# Patient Record
Sex: Male | Born: 1935 | Race: White | Hispanic: No | Smoking: Former smoker
Health system: Southern US, Community
[De-identification: ages and names within clinical notes are randomized; demographics above are authoritative.]

## PROBLEM LIST (undated history)

## (undated) DIAGNOSIS — M1710 Unilateral primary osteoarthritis, unspecified knee: Secondary | ICD-10-CM

## (undated) DIAGNOSIS — L210 Seborrhea capitis: Secondary | ICD-10-CM

## (undated) DIAGNOSIS — G309 Alzheimer's disease, unspecified: Secondary | ICD-10-CM

## (undated) DIAGNOSIS — F32A Depression, unspecified: Secondary | ICD-10-CM

## (undated) DIAGNOSIS — N3090 Cystitis, unspecified without hematuria: Secondary | ICD-10-CM

## (undated) DIAGNOSIS — F329 Major depressive disorder, single episode, unspecified: Secondary | ICD-10-CM

## (undated) DIAGNOSIS — E559 Vitamin D deficiency, unspecified: Secondary | ICD-10-CM

## (undated) DIAGNOSIS — E46 Unspecified protein-calorie malnutrition: Secondary | ICD-10-CM

## (undated) DIAGNOSIS — B9689 Other specified bacterial agents as the cause of diseases classified elsewhere: Secondary | ICD-10-CM

## (undated) DIAGNOSIS — K5900 Constipation, unspecified: Secondary | ICD-10-CM

## (undated) DIAGNOSIS — I739 Peripheral vascular disease, unspecified: Secondary | ICD-10-CM

## (undated) DIAGNOSIS — B961 Klebsiella pneumoniae [K. pneumoniae] as the cause of diseases classified elsewhere: Secondary | ICD-10-CM

## (undated) DIAGNOSIS — I10 Essential (primary) hypertension: Secondary | ICD-10-CM

## (undated) DIAGNOSIS — IMO0002 Reserved for concepts with insufficient information to code with codable children: Secondary | ICD-10-CM

## (undated) DIAGNOSIS — F0280 Dementia in other diseases classified elsewhere without behavioral disturbance: Secondary | ICD-10-CM

## (undated) DIAGNOSIS — N189 Chronic kidney disease, unspecified: Secondary | ICD-10-CM

## (undated) DIAGNOSIS — D638 Anemia in other chronic diseases classified elsewhere: Secondary | ICD-10-CM

## (undated) HISTORY — DX: Cystitis, unspecified without hematuria: N30.90

## (undated) HISTORY — DX: Alzheimer's disease, unspecified: G30.9

## (undated) HISTORY — DX: Constipation, unspecified: K59.00

## (undated) HISTORY — DX: Dementia in other diseases classified elsewhere, unspecified severity, without behavioral disturbance, psychotic disturbance, mood disturbance, and anxiety: F02.80

## (undated) HISTORY — DX: Major depressive disorder, single episode, unspecified: F32.9

## (undated) HISTORY — DX: Depression, unspecified: F32.A

## (undated) HISTORY — DX: Other specified bacterial agents as the cause of diseases classified elsewhere: B96.89

## (undated) HISTORY — DX: Essential (primary) hypertension: I10

## (undated) HISTORY — DX: Anemia in other chronic diseases classified elsewhere: D63.8

## (undated) HISTORY — DX: Peripheral vascular disease, unspecified: I73.9

## (undated) HISTORY — DX: Vitamin D deficiency, unspecified: E55.9

## (undated) HISTORY — DX: Unspecified protein-calorie malnutrition: E46

## (undated) HISTORY — DX: Unilateral primary osteoarthritis, unspecified knee: M17.10

## (undated) HISTORY — DX: Chronic kidney disease, unspecified: N18.9

## (undated) HISTORY — DX: Seborrhea capitis: L21.0

## (undated) HISTORY — DX: Klebsiella pneumoniae (k. pneumoniae) as the cause of diseases classified elsewhere: B96.1

## (undated) HISTORY — DX: Reserved for concepts with insufficient information to code with codable children: IMO0002

---

## 2014-09-17 ENCOUNTER — Non-Acute Institutional Stay (SKILLED_NURSING_FACILITY): Payer: Medicare Other | Admitting: Internal Medicine

## 2014-09-17 DIAGNOSIS — Z993 Dependence on wheelchair: Secondary | ICD-10-CM | POA: Diagnosis not present

## 2014-09-17 DIAGNOSIS — IMO0002 Reserved for concepts with insufficient information to code with codable children: Secondary | ICD-10-CM

## 2014-09-17 DIAGNOSIS — K5900 Constipation, unspecified: Secondary | ICD-10-CM

## 2014-09-17 DIAGNOSIS — M179 Osteoarthritis of knee, unspecified: Secondary | ICD-10-CM | POA: Diagnosis not present

## 2014-09-17 DIAGNOSIS — F0280 Dementia in other diseases classified elsewhere without behavioral disturbance: Secondary | ICD-10-CM | POA: Insufficient documentation

## 2014-09-17 DIAGNOSIS — G309 Alzheimer's disease, unspecified: Secondary | ICD-10-CM

## 2014-09-17 DIAGNOSIS — F330 Major depressive disorder, recurrent, mild: Secondary | ICD-10-CM | POA: Diagnosis not present

## 2014-09-17 DIAGNOSIS — I1310 Hypertensive heart and chronic kidney disease without heart failure, with stage 1 through stage 4 chronic kidney disease, or unspecified chronic kidney disease: Secondary | ICD-10-CM | POA: Diagnosis not present

## 2014-09-17 DIAGNOSIS — G301 Alzheimer's disease with late onset: Secondary | ICD-10-CM

## 2014-09-17 DIAGNOSIS — E559 Vitamin D deficiency, unspecified: Secondary | ICD-10-CM

## 2014-09-17 DIAGNOSIS — M1710 Unilateral primary osteoarthritis, unspecified knee: Secondary | ICD-10-CM

## 2014-09-17 NOTE — Progress Notes (Signed)
Patient ID: Barry Castillo, male   DOB: 05/31/1917, 81 y.o.   MRN: [id number]     [facility]   PCP: No primary care provider on file.  Code Status: full code  No Known Allergies  Chief Complaint  Patient presents with  . New Admit To SNF     HPI:  81 year old patient is here for long term care. He has history of dementia, HTN, CKD, depression and is a high fall risk. Reviewed his FL2 form. Patient is seen in his room today. He is lying in bed, in no acute distress. He is intermittently confused, incontinent with bowel and bladder and needs assistance with bathing and dressing as per FL2. He is hard of hearing, can verbally communicate his needs and is on mechanical soft diet.  Review of Systems:  Constitutional: Negative for fever, chills, diaphoresis.  HENT: Negative for headache, congestion Respiratory: Negative for cough, shortness of breath and wheezing.   Cardiovascular: Negative for chest pain, palpitations, leg swelling.  Gastrointestinal: Negative for heartburn, nausea, vomiting, abdominal pain Genitourinary: Negative for dysuria Musculoskeletal: Negative for back pain, falls Skin: Negative for itching, rash.  Neurological: Negative for dizziness, tingling, focal weakness Psychiatric/Behavioral: positive for memory loss and depression.    Past Medical History  Diagnosis Date  . Chronic kidney disease   . Depression    No past surgical history on file. Social History:   reports that he does not drink alcohol or use illicit drugs. His tobacco history is not on file.  No family history on file.  Medications: Patient's Medications  New Prescriptions   No medications on file  Previous Medications   ACETAMINOPHEN (TYLENOL) 500 MG TABLET    Take 500 mg by mouth 2 (two) times daily.   CHOLECALCIFEROL (VITAMIN D) 1000 UNITS TABLET    Take 2,000 Units by mouth daily.   ESCITALOPRAM (LEXAPRO) 5 MG TABLET    Take 5 mg by mouth daily.   LISINOPRIL (PRINIVIL,ZESTRIL) 2.5 MG TABLET    Take 2.5 mg by mouth once.   SENNA (SENOKOT) 8.6 MG TABS TABLET    Take 1 tablet by mouth daily.  Modified Medications   No medications on file  Discontinued Medications   SELENIUM SULFIDE (SELSUN) 2.5 % SHAMPOO    Apply 1 application topically 2 (two) times a week.     Physical Exam Filed Vitals:   09/17/14 1453  BP: 156/88  Pulse: 74  Temp: 96.6 F (35.9 C)  Resp: 18  SpO2: 98%    General- elderly male, well built, in no acute distress Head- normocephalic, atraumatic Throat- moist mucus membrane Eyes- PERRLA, EOMI, no pallor, no icterus, no discharge, normal conjunctiva, normal sclera Neck- no cervical lymphadenopathy Cardiovascular- normal s1,s2, no murmurs, good radial pulses, no leg edema Respiratory- bilateral clear to auscultation, no wheeze, no rhonchi, no crackles, no use of accessory muscles Abdomen- bowel sounds present, soft, non tender Musculoskeletal- able to move all 4 extremities, on wheelchair Neurological- no focal deficit Skin- warm and dry Psychiatry- alert and oriented to person, normal mood and affect    Labs reviewed: None available for review  Assessment/Plan  Alzheimer's disease Stable, decline anticipated, continue assistance with ADLS, falls precuations, pressure ulcer prophylaxis, encourage out of bed. Monitor po intake and weight. On mechanical soft diet  Major depressive disorder Mood currently stable, continue lexapro 5 mg daily  Vitamin d def On vitamin d 2000 u daily, check vit d level  Hypertensive heart and  ckd bp slightly elevated, check bp once a day for next 1 week and if > 3 readings are > 140/90, adjust lisinopril. Check bmp, lipid panel  Constipation Continue senokot  OA Continue tylenol 500 mg bid and monitor  Wheelchair dependence Fall precautions  Goals of care: short term rehabilitation   Labs/tests ordered: cbc with diff, cmp, tsh, vitamin d , lipid  panel  Family/ staff Communication: reviewed care plan with patient and nursing supervisor    Oneal Grout, MD  [facility] [phone] (Monday-Friday 8 am - 5 pm) [phone] (afterhours)

## 2014-09-22 LAB — LIPID PANEL
Cholesterol: 146 mg/dL (ref 0–200)
HDL: 38 mg/dL (ref 35–70)
LDL Cholesterol: 94 mg/dL
TRIGLYCERIDES: 71 mg/dL (ref 40–160)

## 2014-09-22 LAB — TSH: TSH: 3.48 u[IU]/mL (ref 0.41–5.90)

## 2014-10-14 ENCOUNTER — Encounter: Payer: Self-pay | Admitting: Adult Health

## 2014-10-14 ENCOUNTER — Non-Acute Institutional Stay (SKILLED_NURSING_FACILITY): Payer: Medicare Other | Admitting: Adult Health

## 2014-10-14 DIAGNOSIS — K5900 Constipation, unspecified: Secondary | ICD-10-CM

## 2014-10-14 DIAGNOSIS — F330 Major depressive disorder, recurrent, mild: Secondary | ICD-10-CM

## 2014-10-14 DIAGNOSIS — Z993 Dependence on wheelchair: Secondary | ICD-10-CM

## 2014-10-14 DIAGNOSIS — E559 Vitamin D deficiency, unspecified: Secondary | ICD-10-CM

## 2014-10-14 DIAGNOSIS — I10 Essential (primary) hypertension: Secondary | ICD-10-CM | POA: Diagnosis not present

## 2014-10-14 DIAGNOSIS — M179 Osteoarthritis of knee, unspecified: Secondary | ICD-10-CM | POA: Diagnosis not present

## 2014-10-14 DIAGNOSIS — F0280 Dementia in other diseases classified elsewhere without behavioral disturbance: Secondary | ICD-10-CM

## 2014-10-14 DIAGNOSIS — IMO0002 Reserved for concepts with insufficient information to code with codable children: Secondary | ICD-10-CM

## 2014-10-14 DIAGNOSIS — G309 Alzheimer's disease, unspecified: Secondary | ICD-10-CM | POA: Diagnosis not present

## 2014-10-14 DIAGNOSIS — E46 Unspecified protein-calorie malnutrition: Secondary | ICD-10-CM | POA: Diagnosis not present

## 2014-10-14 DIAGNOSIS — M1710 Unilateral primary osteoarthritis, unspecified knee: Secondary | ICD-10-CM

## 2014-10-14 NOTE — Progress Notes (Signed)
Patient ID: Barry Castillo, male   DOB: 02/16/1917, 81 y.o.   MRN: [id number]   10/14/2014  Facility:  Nursing Home Location:  [facility] Room Number: 1003-1 LEVEL OF CARE:  SNF (31)    Chief Complaint  Patient presents with  . Medical Management of Chronic Issues    Depression, hypertension, constipation, vitamin D deficiency, osteoarthrosis, protein calorie malnutrition, and wheelchair dependence    HISTORY OF PRESENT ILLNESS:   This is a 81 year old male who [facility]. He has been admitted for long-term care. He has history of dementia, hypertension, depression, cataract and falls.  Lab review shows albumin of 3.3 - low. He eats 50-100% of his meals. Referral to RD will be done.  PAST MEDICAL HISTORY:  Past Medical History  Diagnosis Date  . Chronic kidney disease   . Depression     CURRENT MEDICATIONS: Reviewed per MAR/see medication list  No Known Allergies   REVIEW OF SYSTEMS: unable to obtain due to advance dementia   PHYSICAL EXAMINATION  GENERAL: no acute distress, normal body habitus EYES: conjunctivae normal, sclerae normal, normal eye lids NECK: supple, trachea midline, no neck masses, no thyroid tenderness, no thyromegaly LYMPHATICS: no LAN in the neck, no supraclavicular LAN RESPIRATORY: breathing is even & unlabored, BS CTAB CARDIAC: RRR, no murmur,no extra heart sounds, no edema GI: abdomen soft, normal BS, no masses, no tenderness, no hepatomegaly, no splenomegaly EXTREMITIES: able to move all 4 extremities PSYCHIATRIC: the patient is alert & oriented to person, affect & behavior appropriate  LABS/RADIOLOGY: 09/18/14  vitamin D total 38 vitamin D 3  38 vitamin D 2 <8 vitamin D 33 WBC 6.0 hemoglobin 12.5 hematocrit 37.0 MCV 93.0 sodium 137 potassium 4.3 glucose 83 BUN 17 creatinine 0.98 total bilirubin 1.1 alkaline phosphatase 50 SGOT 21 SGPT 19 total protein 6.1 albumin 3.3  calcium 8.4 cholesterol 146 triglyceride 71 HDL 38 LDL 94 TSH 3.481  ASSESSMENT/PLAN:  Hypertension - well controlled; continue lisinopril 2.5 mg by mouth daily Depression - mood is stable; continue Lexapro 5 mg by mouth daily Constipation - continue senna 1 tab by mouth daily Vitamin D deficiency - continue supplementation Osteoarthrosis - continue Tylenol 500 mg by mouth twice a day PRN Protein calorie malnutrition - albumin 3.3; RD consultation; continue supplementation Dependence and wheelchair - fall precautions; continue physical support Alzheimer's dementia - stable   Goals of care:   long-term care   Labs/test ordered: none     [facility]INA, NP [facility] [phone]

## 2014-12-10 ENCOUNTER — Non-Acute Institutional Stay (SKILLED_NURSING_FACILITY): Payer: Medicare Other | Admitting: Adult Health

## 2014-12-10 ENCOUNTER — Encounter: Payer: Self-pay | Admitting: Adult Health

## 2014-12-10 DIAGNOSIS — M1710 Unilateral primary osteoarthritis, unspecified knee: Secondary | ICD-10-CM

## 2014-12-10 DIAGNOSIS — F330 Major depressive disorder, recurrent, mild: Secondary | ICD-10-CM | POA: Diagnosis not present

## 2014-12-10 DIAGNOSIS — K5900 Constipation, unspecified: Secondary | ICD-10-CM

## 2014-12-10 DIAGNOSIS — E46 Unspecified protein-calorie malnutrition: Secondary | ICD-10-CM

## 2014-12-10 DIAGNOSIS — E559 Vitamin D deficiency, unspecified: Secondary | ICD-10-CM | POA: Diagnosis not present

## 2014-12-10 DIAGNOSIS — G309 Alzheimer's disease, unspecified: Secondary | ICD-10-CM | POA: Diagnosis not present

## 2014-12-10 DIAGNOSIS — IMO0002 Reserved for concepts with insufficient information to code with codable children: Secondary | ICD-10-CM

## 2014-12-10 DIAGNOSIS — I10 Essential (primary) hypertension: Secondary | ICD-10-CM

## 2014-12-10 DIAGNOSIS — Z993 Dependence on wheelchair: Secondary | ICD-10-CM | POA: Diagnosis not present

## 2014-12-10 DIAGNOSIS — M179 Osteoarthritis of knee, unspecified: Secondary | ICD-10-CM

## 2014-12-10 DIAGNOSIS — F0280 Dementia in other diseases classified elsewhere without behavioral disturbance: Secondary | ICD-10-CM

## 2014-12-10 NOTE — Progress Notes (Signed)
Patient ID: Barry Castillo, male   DOB: 10-Dec-1916, 81 y.o.   MRN: [id number]   12/10/2014  Facility:  Nursing Home Location:  [facility] Room Number: 1003-1 LEVEL OF CARE:  SNF (31)    Chief Complaint  Patient presents with  . Medical Management of Chronic Issues    Hypertension, depression, constipation, Alzheimer's dementia, wheelchair dependence, protein calorie malnutrition and vitamin D deficiency    HISTORY OF PRESENT ILLNESS:   This is a 81 year old male who is being seen for a routine visit. He is a long term resident @ [address]. Latest blood pressure is 134/64, well controlled hypertension. He takes lisinopril 2.5 mg by mouth daily. His mood this is stable and currently takes Lexapro 5 mg daily for depression. His dementia is stable. He uses wheelchair to move around the facility.   PAST MEDICAL HISTORY:  Past Medical History  Diagnosis Date  . Chronic kidney disease   . Depression     CURRENT MEDICATIONS: Reviewed per MAR/see medication list  No Known Allergies   REVIEW OF SYSTEMS: unable to obtain due to advance dementia   PHYSICAL EXAMINATION  GENERAL: no acute distress, normal body habitus NECK: supple, trachea midline, no neck masses, no thyroid tenderness, no thyromegaly LYMPHATICS: no LAN in the neck, no supraclavicular LAN RESPIRATORY: breathing is even & unlabored, BS CTAB CARDIAC: RRR, no murmur,no extra heart sounds, no edema GI: abdomen soft, normal BS, no masses, no tenderness, no hepatomegaly, no splenomegaly EXTREMITIES: able to move all 4 extremities; uses wheelchair PSYCHIATRIC: the patient is alert & oriented to person, affect & behavior appropriate  LABS/RADIOLOGY: 09/22/14  WBC 6.0 hemoglobin 12.5 hematocrit 37.0 MCV 93.0 platelet 208 sodium 137 potassium 4.3 glucose 83 BUN 17 creatinine 0.98 total bilirubin 1.1 alkaline phosphatase 50 SGOT 21 SGPT 19 total protein 6.1 albumin 3.3 calcium 8.4 cholesterol 146  triglycerides 71 HDL 38 LDL 94 TSH 3.481 09/18/14  vitamin D total 38 vitamin D 3  38 vitamin D 2 <8 vitamin D 33 WBC 6.0 hemoglobin 12.5 hematocrit 37.0 MCV 93.0 sodium 137 potassium 4.3 glucose 83 BUN 17 creatinine 0.98 total bilirubin 1.1 alkaline phosphatase 50 SGOT 21 SGPT 19 total protein 6.1 albumin 3.3 calcium 8.4 cholesterol 146 triglyceride 71 HDL 38 LDL 94 TSH 3.481  ASSESSMENT/PLAN:  Hypertension - well controlled; continue lisinopril 2.5 mg by mouth daily Depression - mood is stable; continue Lexapro 5 mg by mouth daily Constipation - continue senna 1 tab by mouth daily Vitamin D deficiency - continue vitamin D 3 2000 units 1 capsule by mouth daily at bedtime Osteoarthrosis - continue Tylenol 500 mg by mouth twice a day PRN Protein calorie malnutrition - albumin 3.3;  continue supplementation Dependence and wheelchair - fall precautions; continue physical support Alzheimer's dementia - stable   Goals of care:   long-term care   Labs/test ordered: none     [facility], NP [facility] [phone]

## 2015-01-14 ENCOUNTER — Encounter: Payer: Self-pay | Admitting: Adult Health

## 2015-01-14 ENCOUNTER — Non-Acute Institutional Stay (SKILLED_NURSING_FACILITY): Payer: Medicare Other | Admitting: Adult Health

## 2015-01-14 DIAGNOSIS — I10 Essential (primary) hypertension: Secondary | ICD-10-CM

## 2015-01-14 DIAGNOSIS — F330 Major depressive disorder, recurrent, mild: Secondary | ICD-10-CM

## 2015-01-14 DIAGNOSIS — K5900 Constipation, unspecified: Secondary | ICD-10-CM | POA: Diagnosis not present

## 2015-01-14 DIAGNOSIS — IMO0002 Reserved for concepts with insufficient information to code with codable children: Secondary | ICD-10-CM

## 2015-01-14 DIAGNOSIS — E559 Vitamin D deficiency, unspecified: Secondary | ICD-10-CM

## 2015-01-14 DIAGNOSIS — F0280 Dementia in other diseases classified elsewhere without behavioral disturbance: Secondary | ICD-10-CM

## 2015-01-14 DIAGNOSIS — M1710 Unilateral primary osteoarthritis, unspecified knee: Secondary | ICD-10-CM

## 2015-01-14 DIAGNOSIS — M179 Osteoarthritis of knee, unspecified: Secondary | ICD-10-CM | POA: Diagnosis not present

## 2015-01-14 DIAGNOSIS — G309 Alzheimer's disease, unspecified: Secondary | ICD-10-CM

## 2015-01-23 NOTE — Progress Notes (Signed)
Patient ID: Barry Castillo, male   DOB: Feb 10, 1917, 81 y.o.   MRN: [id number]   01/14/15  Facility:  Nursing Home Location:  [facility] Room Number: 1003-1 LEVEL OF CARE:  SNF (31)   Chief Complaint  Patient presents with  . Medical Management of Chronic Issues    Hypertension, depression, constipation, Alzheimer's dementia,  protein calorie malnutrition and vitamin D deficiency    HISTORY OF PRESENT ILLNESS:   This is a 81 year old male who is being seen for a routine visit. He is a long term resident @ [address]. Latest blood pressure is 129/58, well controlled hypertension. He takes lisinopril 2.5 mg by mouth daily. His mood is stable and currently takes Lexapro 5 mg daily for depression. His dementia is stable. He uses wheelchair to move around the facility.  PAST MEDICAL HISTORY:  Past Medical History  Diagnosis Date  . Chronic kidney disease   . Depression     CURRENT MEDICATIONS: Reviewed per MAR/see medication list    Medication List       This list is accurate as of: 01/14/15 11:59 PM.  Always use your most recent med list.               acetaminophen 500 MG tablet  Commonly known as:  TYLENOL  Take 500 mg by mouth 2 (two) times daily.     cholecalciferol 1000 UNITS tablet  Commonly known as:  VITAMIN D  Take 2,000 Units by mouth daily.     escitalopram 5 MG tablet  Commonly known as:  LEXAPRO  Take 5 mg by mouth daily. For depression     lisinopril 2.5 MG tablet  Commonly known as:  PRINIVIL,ZESTRIL  Take 2.5 mg by mouth daily. For HTN     senna 8.6 MG Tabs tablet  Commonly known as:  SENOKOT  Take 1 tablet by mouth daily. For constipation     SYSTANE 0.4-0.3 % Gel  Generic drug:  Polyethyl Glycol-Propyl Glycol  Apply 1 drop to eye 3 (three) times daily.         No Known Allergies   REVIEW OF SYSTEMS: unable to obtain due to advance dementia   PHYSICAL EXAMINATION  GENERAL: no acute distress, normal body  habitus NECK: supple, trachea midline, no neck masses, no thyroid tenderness, no thyromegaly LYMPHATICS: no LAN in the neck, no supraclavicular LAN RESPIRATORY: breathing is even & unlabored, BS CTAB CARDIAC: RRR, no murmur,no extra heart sounds, no edema GI: abdomen soft, normal BS, no masses, no tenderness, no hepatomegaly, no splenomegaly EXTREMITIES: able to move all 4 extremities; uses wheelchair PSYCHIATRIC: the patient is alert & oriented to person, affect & behavior appropriate  LABS/RADIOLOGY: Labs reviewed: 09/22/14  WBC 6.0 hemoglobin 12.5 hematocrit 37.0 MCV 93.0 platelet 208 sodium 137 potassium 4.3 glucose 83 BUN 17 creatinine 0.98 total bilirubin 1.1 alkaline phosphatase 50 SGOT 21 SGPT 19 total protein 6.1 albumin 3.3 calcium 8.4 cholesterol 146 triglycerides 71 HDL 38 LDL 94 TSH 3.481 09/18/14  vitamin D total 38 vitamin D 3  38 vitamin D 2 <8 vitamin D 33 WBC 6.0 hemoglobin 12.5 hematocrit 37.0 MCV 93.0 sodium 137 potassium 4.3 glucose 83 BUN 17 creatinine 0.98 total bilirubin 1.1 alkaline phosphatase 50 SGOT 21 SGPT 19 total protein 6.1 albumin 3.3 calcium 8.4 cholesterol 146 triglyceride 71 HDL 38 LDL 94 TSH 3.481  ASSESSMENT/PLAN:  Hypertension - well controlled; continue lisinopril 2.5 mg by mouth daily Depression - mood is stable; continue Lexapro  5 mg by mouth daily Constipation - continue senna 1 tab by mouth daily Vitamin D deficiency - continue vitamin D 3 2000 units 1 capsule by mouth daily at bedtime Osteoarthrosis - continue Tylenol 500 mg by mouth twice a day PRN Protein calorie malnutrition - albumin 3.3;  continue supplementation Alzheimer's dementia - stable   Goals of care:   long-term care   Labs/test ordered: none     [facility], NP [facility] [phone]

## 2015-02-05 ENCOUNTER — Non-Acute Institutional Stay (SKILLED_NURSING_FACILITY): Payer: Medicare Other | Admitting: Adult Health

## 2015-02-05 ENCOUNTER — Encounter: Payer: Self-pay | Admitting: Adult Health

## 2015-02-05 DIAGNOSIS — F330 Major depressive disorder, recurrent, mild: Secondary | ICD-10-CM

## 2015-02-05 DIAGNOSIS — M1710 Unilateral primary osteoarthritis, unspecified knee: Secondary | ICD-10-CM

## 2015-02-05 DIAGNOSIS — M179 Osteoarthritis of knee, unspecified: Secondary | ICD-10-CM | POA: Diagnosis not present

## 2015-02-05 DIAGNOSIS — IMO0002 Reserved for concepts with insufficient information to code with codable children: Secondary | ICD-10-CM

## 2015-02-05 DIAGNOSIS — K5900 Constipation, unspecified: Secondary | ICD-10-CM

## 2015-02-05 DIAGNOSIS — G309 Alzheimer's disease, unspecified: Secondary | ICD-10-CM | POA: Diagnosis not present

## 2015-02-05 DIAGNOSIS — I10 Essential (primary) hypertension: Secondary | ICD-10-CM

## 2015-02-05 DIAGNOSIS — F0280 Dementia in other diseases classified elsewhere without behavioral disturbance: Secondary | ICD-10-CM

## 2015-02-05 DIAGNOSIS — E559 Vitamin D deficiency, unspecified: Secondary | ICD-10-CM | POA: Diagnosis not present

## 2015-02-05 DIAGNOSIS — E46 Unspecified protein-calorie malnutrition: Secondary | ICD-10-CM | POA: Diagnosis not present

## 2015-04-13 LAB — BASIC METABOLIC PANEL
BUN: 12 mg/dL (ref 4–21)
CREATININE: 0.9 mg/dL (ref 0.6–1.3)
Glucose: 78 mg/dL
Potassium: 3.7 mmol/L (ref 3.4–5.3)
SODIUM: 139 mmol/L (ref 137–147)

## 2015-04-13 LAB — HEPATIC FUNCTION PANEL
ALT: 8 U/L — AB (ref 10–40)
AST: 15 U/L (ref 14–40)
Alkaline Phosphatase: 70 U/L (ref 25–125)
BILIRUBIN, TOTAL: 0.7 mg/dL

## 2015-04-13 LAB — CBC AND DIFFERENTIAL
HCT: 37 % — AB (ref 41–53)
Hemoglobin: 12 g/dL — AB (ref 13.5–17.5)
Platelets: 210 10*3/uL (ref 150–399)
WBC: 6.5 10^3/mL

## 2015-04-18 NOTE — Progress Notes (Signed)
Patient ID: Barry Castillo, male   DOB: 11/29/1916, 81 y.o.   MRN: [id number]   02/05/15  Facility:  Nursing Home Location:  [facility] Room Number: 1003-1 LEVEL OF CARE:  SNF (31)   Chief Complaint  Patient presents with  . Medical Management of Chronic Issues    Hypertension, depression, constipation, osteoarthrosis, Alzheimer's dementia, protein calorie malnutrition  and vitamin D deficiency    HISTORY OF PRESENT ILLNESS:   This is a 81 year old male who is being seen for a routine visit. He is a long term resident [facility] [address] [facility]. He takes lisinopril 2.5 mg by mouth daily. His mood is stable and currently takes Lexapro 5 mg daily for depression. His dementia is stable. He uses wheelchair to move around the facility.  PAST MEDICAL HISTORY:  Past Medical History  Diagnosis Date  . Chronic kidney disease   . Depression     CURRENT MEDICATIONS: Reviewed per MAR/see medication list    Medication List       This list is accurate as of: 02/05/15 11:59 PM.  Always use your most recent med list.               acetaminophen 500 MG tablet  Commonly known as:  TYLENOL  Take 500 mg by mouth 2 (two) times daily.     cholecalciferol 1000 UNITS tablet  Commonly known as:  VITAMIN D  Take 2,000 Units by mouth daily.     escitalopram 5 MG tablet  Commonly known as:  LEXAPRO  Take 5 mg by mouth daily. For depression     lisinopril 2.5 MG tablet  Commonly known as:  PRINIVIL,ZESTRIL  Take 2.5 mg by mouth daily. For HTN     senna 8.6 MG Tabs tablet  Commonly known as:  SENOKOT  Take 1 tablet by mouth daily. For constipation     SYSTANE 0.4-0.3 % Gel ophthalmic gel  Generic drug:  Polyethyl Glycol-Propyl Glycol  Apply 1 drop to eye 3 (three) times daily.        No Known Allergies   REVIEW OF SYSTEMS: unable to obtain due to advance dementia   PHYSICAL EXAMINATION  GENERAL: no acute distress, normal body habitus NECK: supple, trachea  midline, no neck masses, no thyroid tenderness, no thyromegaly LYMPHATICS: no LAN in the neck, no supraclavicular LAN RESPIRATORY: breathing is even & unlabored, BS CTAB CARDIAC: RRR, no murmur,no extra heart sounds, no edema GI: abdomen soft, normal BS, no masses, no tenderness, no hepatomegaly, no splenomegaly EXTREMITIES: able to move all 4 extremities; uses wheelchair PSYCHIATRIC: the patient is alert & oriented to person only, affect & behavior appropriate  LABS/RADIOLOGY: Labs reviewed: 09/22/14  WBC 6.0 hemoglobin 12.5 hematocrit 37.0 MCV 93.0 platelet 208 sodium 137 potassium 4.3 glucose 83 BUN 17 creatinine 0.98 total bilirubin 1.1 alkaline phosphatase 50 SGOT 21 SGPT 19 total protein 6.1 albumin 3.3 calcium 8.4 cholesterol 146 triglycerides 71 HDL 38 LDL 94 TSH 3.481 09/18/14  vitamin D total 38 vitamin D 3  38 vitamin D 2 <8 vitamin D 33 WBC 6.0 hemoglobin 12.5 hematocrit 37.0 MCV 93.0 sodium 137 potassium 4.3 glucose 83 BUN 17 creatinine 0.98 total bilirubin 1.1 alkaline phosphatase 50 SGOT 21 SGPT 19 total protein 6.1 albumin 3.3 calcium 8.4 cholesterol 146 triglyceride 71 HDL 38 LDL 94 TSH 3.481  ASSESSMENT/PLAN:  Hypertension - well controlled; continue lisinopril 2.5 mg by mouth daily  Depression - mood is stable; continue Lexapro 5 mg by mouth  daily  Constipation - continue senna 1 tab by mouth daily  Vitamin D deficiency - continue vitamin D 3 2000 units 1 capsule by mouth daily at bedtime  Osteoarthrosis - continue Tylenol 500 mg by mouth twice a day PRN  Protein calorie malnutrition - albumin 3.3;  continue supplementation  Alzheimer's dementia - stable     Goals of care:   long-term care    [facility], NP [facility] [phone]

## 2015-04-26 ENCOUNTER — Non-Acute Institutional Stay (SKILLED_NURSING_FACILITY): Payer: Medicare Other | Admitting: Internal Medicine

## 2015-04-26 ENCOUNTER — Encounter: Payer: Self-pay | Admitting: Internal Medicine

## 2015-04-26 DIAGNOSIS — K5900 Constipation, unspecified: Secondary | ICD-10-CM

## 2015-04-26 DIAGNOSIS — M179 Osteoarthritis of knee, unspecified: Secondary | ICD-10-CM

## 2015-04-26 DIAGNOSIS — B9689 Other specified bacterial agents as the cause of diseases classified elsewhere: Secondary | ICD-10-CM

## 2015-04-26 DIAGNOSIS — D638 Anemia in other chronic diseases classified elsewhere: Secondary | ICD-10-CM | POA: Diagnosis not present

## 2015-04-26 DIAGNOSIS — N3090 Cystitis, unspecified without hematuria: Secondary | ICD-10-CM

## 2015-04-26 DIAGNOSIS — G309 Alzheimer's disease, unspecified: Secondary | ICD-10-CM

## 2015-04-26 DIAGNOSIS — I10 Essential (primary) hypertension: Secondary | ICD-10-CM

## 2015-04-26 DIAGNOSIS — M1710 Unilateral primary osteoarthritis, unspecified knee: Secondary | ICD-10-CM

## 2015-04-26 DIAGNOSIS — E46 Unspecified protein-calorie malnutrition: Secondary | ICD-10-CM | POA: Diagnosis not present

## 2015-04-26 DIAGNOSIS — F330 Major depressive disorder, recurrent, mild: Secondary | ICD-10-CM

## 2015-04-26 DIAGNOSIS — F0280 Dementia in other diseases classified elsewhere without behavioral disturbance: Secondary | ICD-10-CM

## 2015-04-26 DIAGNOSIS — B961 Klebsiella pneumoniae [K. pneumoniae] as the cause of diseases classified elsewhere: Secondary | ICD-10-CM

## 2015-04-26 DIAGNOSIS — IMO0002 Reserved for concepts with insufficient information to code with codable children: Secondary | ICD-10-CM

## 2015-04-26 NOTE — Progress Notes (Signed)
Patient ID: Barry Castillo, male   DOB: 03-05-1917, 81 y.o.   MRN: [id number]      [facility]   PCP: No primary care provider on file.  Code Status: full code  No Known Allergies  Chief Complaint  Patient presents with  . Medical Management of Chronic Issues    Routine Visit      HPI:  81 year old patient is seen for routine visit and is here for long term care. He has history of dementia, HTN, CKD, depression and is a high fall risk. He is intermittently confused, incontinent with bowel and bladder and needs assistance with bathing and dressing. He can feed himself. No recent falls reported. He is hard of hearing, can verbally communicate his needs. He is currently being treated for klebsiella UTI  Review of Systems:  Constitutional: Negative for fever, chills, diaphoresis.  HENT: Negative for headache, congestion Respiratory: Negative for cough, shortness of breath and wheezing.   Cardiovascular: Negative for chest pain, palpitations, leg swelling.  Gastrointestinal: Negative for heartburn, nausea, vomiting, abdominal pain Genitourinary: Negative for dysuria Musculoskeletal: Negative for back pain, falls Skin: Negative for itching, rash.  Neurological: Negative for dizziness, tingling, focal weakness Psychiatric/Behavioral: positive for memory loss and depression.    Past Medical History  Diagnosis Date  . Chronic kidney disease   . Depression    Medications: Patient's Medications  New Prescriptions   No medications on file  Previous Medications   ACETAMINOPHEN (TYLENOL) 500 MG TABLET    Take 500 mg by mouth 2 (two) times daily.   CHOLECALCIFEROL (VITAMIN D3) 2000 UNITS TABS    Take by mouth daily.   CIPROFLOXACIN (CIPRO) 250 MG TABLET    Take 250 mg by mouth 2 (two) times daily. End 04/28/15   ESCITALOPRAM (LEXAPRO) 5 MG TABLET    Take 5 mg by mouth daily. For depression   LISINOPRIL (PRINIVIL,ZESTRIL) 2.5 MG TABLET    Take 2.5 mg  by mouth daily. For HTN   LOPERAMIDE (IMODIUM) 2 MG CAPSULE    Take 4 mg by mouth once. With initial episode of diarrhea   NUTRITIONAL SUPPLEMENTS (BALANCED NUTRITIONAL SHAKE PLS PO)    Take by mouth. Mighty shake twice daily with med pass 2 pm, 8 pm   NYSTATIN CREAM (MYCOSTATIN)    Apply 1 application topically 2 (two) times daily as needed for dry skin.   POLYETHYL GLYCOL-PROPYL GLYCOL (SYSTANE) 0.4-0.3 % GEL    Apply 1 drop to eye 3 (three) times daily.   PROTEIN (PROCEL) POWD    Take 1 scoop by mouth 2 (two) times daily.   SACCHAROMYCES BOULARDII (FLORASTOR) 250 MG CAPSULE    Take 250 mg by mouth 2 (two) times daily. X 2 weeks, end 05/02/15   SENNA (SENOKOT) 8.6 MG TABS TABLET    Take 1 tablet by mouth daily. For constipation   ZINC OXIDE (BALMEX) 11.3 % CREA CREAM    Apply 1 application topically. After each incontinent episode each shift  Modified Medications   No medications on file  Discontinued Medications   CHOLECALCIFEROL (VITAMIN D) 1000 UNITS TABLET    Take 2,000 Units by mouth daily.     Physical Exam Filed Vitals:   04/26/15 1516  BP: 136/69  Pulse: 56  Temp: 98.1 F (36.7 C)  TempSrc: Oral  Resp: 18  SpO2: 96%   Wt Readings from Last 3 Encounters:  04/26/15 191 lb 12.8 oz (87 kg)  02/05/15 193 lb 6.4  oz (87.726 kg)  01/14/15 197 lb 3.2 oz (89.449 kg)    General- elderly male, well built, in no acute distress Head- normocephalic, atraumatic Throat- moist mucus membrane Eyes- PERRLA, EOMI, no pallor, no icterus, no discharge, normal conjunctiva, normal sclera Neck- no cervical lymphadenopathy Cardiovascular- normal s1,s2, no murmurs, good radial pulses, has ankle edema Respiratory- bilateral clear to auscultation, no wheeze, no rhonchi, no crackles, no use of accessory muscles Abdomen- bowel sounds present, soft, non tender Musculoskeletal- able to move all 4 extremities, on wheelchair Neurological- no focal deficit Skin- warm and dry, has  seborrhea Psychiatry- alert and oriented to person, normal mood and affect    Labs reviewed: CBC Latest Ref Rng 04/13/2015  WBC - 6.5  Hemoglobin 13.5 - 17.5 g/dL 12.0(A)  Hematocrit 41 - 53 % 37(A)  Platelets 150 - 399 K/L 210   CMP Latest Ref Rng 04/13/2015  BUN 4 - 21 mg/dL 12  Creatinine 0.6 - 1.3 mg/dL 0.9  Sodium 409137 - 811147 mmol/L 139  Potassium 3.4 - 5.3 mmol/L 3.7  Alkaline Phos 25 - 125 U/L 70  AST 14 - 40 U/L 15  ALT 10 - 40 U/L 8(A)     Assessment/Plan  Klebsiella uti Continue ciprofloxacin 250 mg bid until 04/28/15, currently asymptomatic. Monitor and encourage hydration  Alzheimer's disease Stable, decline anticipated, continue assistance with ADLS, falls precuations, pressure ulcer prophylaxis, encourage out of bed. Monitor po intake and weight. On mechanical soft diet  Protein calorie malnutrition Losing weight. Decline anticipated with his dementia. Monitor weight and po intake. Continue procel and mighty shake.   HTN Stable bp, continue lisinopril 2.5 mg daily and monitor bp  Major depressive disorder Mood currently stable, has been on lexapro atleast since before march on admission. D/c lexapro in attempt of GDR and monitor  OA Continue tylenol 500 mg bid and vitamin d supplement  Constipation Continue senokot  Anemia of chronic disease Mild, monitor clinically for now   [facility] Vernisha Bacote, MD  [facility] [phone] (Monday-Friday 8 am - 5 pm) [phone] (afterhours)

## 2015-06-02 ENCOUNTER — Non-Acute Institutional Stay (SKILLED_NURSING_FACILITY): Payer: Medicare Other | Admitting: Adult Health

## 2015-06-02 DIAGNOSIS — E559 Vitamin D deficiency, unspecified: Secondary | ICD-10-CM

## 2015-06-02 DIAGNOSIS — M179 Osteoarthritis of knee, unspecified: Secondary | ICD-10-CM

## 2015-06-02 DIAGNOSIS — E46 Unspecified protein-calorie malnutrition: Secondary | ICD-10-CM | POA: Diagnosis not present

## 2015-06-02 DIAGNOSIS — F330 Major depressive disorder, recurrent, mild: Secondary | ICD-10-CM | POA: Diagnosis not present

## 2015-06-02 DIAGNOSIS — IMO0002 Reserved for concepts with insufficient information to code with codable children: Secondary | ICD-10-CM

## 2015-06-02 DIAGNOSIS — K5900 Constipation, unspecified: Secondary | ICD-10-CM

## 2015-06-02 DIAGNOSIS — I10 Essential (primary) hypertension: Secondary | ICD-10-CM

## 2015-06-02 DIAGNOSIS — M1710 Unilateral primary osteoarthritis, unspecified knee: Secondary | ICD-10-CM

## 2015-06-14 ENCOUNTER — Encounter: Payer: Self-pay | Admitting: Adult Health

## 2015-06-14 NOTE — Progress Notes (Signed)
Patient ID: Barry Castillo, male   DOB: Feb 04, 1917, 81 y.o.   MRN: [id number]   DATE:  06/02/15  Facility:  Nursing Home Location:  [facility] Room Number: 1003-1 LEVEL OF CARE:  SNF (31)   Chief Complaint  Patient presents with  . Medical Management of Chronic Issues    Hypertension, depression, constipation, osteoarthrosis, Alzheimer's dementia, protein calorie malnutrition  and vitamin D deficiency    HISTORY OF PRESENT ILLNESS:   This is a 81 year old male who is being seen for a routine visit. He is a long term resident @ [address]. He takes lisinopril 2.5 mg by mouth daily. Lexapro was recently discontinued per pharmacy recommendation - GDR attempt. His mood is stable. His dementia is stable. He uses wheelchair to move around the facility.  PAST MEDICAL HISTORY:  Past Medical History  Diagnosis Date  . Chronic kidney disease   . Depression     CURRENT MEDICATIONS: Reviewed per MAR/see medication list    Medication List       This list is accurate as of: 06/02/15 11:59 PM.  Always use your most recent med list.               acetaminophen 500 MG tablet  Commonly known as:  TYLENOL  Take 500 mg by mouth 2 (two) times daily.     BALANCED NUTRITIONAL SHAKE PLS PO  Take by mouth. Mighty shake twice daily with med pass 2 pm, 8 pm     hydrocerin Crea  Apply 1 application topically 2 (two) times daily. To both legs     lisinopril 2.5 MG tablet  Commonly known as:  PRINIVIL,ZESTRIL  Take 2.5 mg by mouth daily. For HTN     loperamide 2 MG capsule  Commonly known as:  IMODIUM  Take 4 mg by mouth once. With initial episode of diarrhea     nystatin cream  Commonly known as:  MYCOSTATIN  Apply 1 application topically 2 (two) times daily as needed for dry skin. Apply to abdominal folds     PROCEL Powd  Take 1 scoop by mouth 2 (two) times daily.     selenium sulfide 2.5 % shampoo  Commonly known as:  SELSUN  Apply 1 application  topically daily as needed for irritation. Tuesdays and Fridays     senna 8.6 MG Tabs tablet  Commonly known as:  SENOKOT  Take 1 tablet by mouth daily as needed. For constipation     SYSTANE 0.4-0.3 % Gel ophthalmic gel  Generic drug:  Polyethyl Glycol-Propyl Glycol  Apply 1 drop to eye 3 (three) times daily.     Vitamin D3 2000 UNITS Tabs  Take by mouth daily.     zinc oxide 11.3 % Crea cream  Commonly known as:  BALMEX  Apply 1 application topically. After each incontinent episode each shift        No Known Allergies   REVIEW OF SYSTEMS: unable to obtain due to advance dementia   PHYSICAL EXAMINATION  GENERAL: no acute distress, normal body habitus NECK: supple, trachea midline, no neck masses, no thyroid tenderness, no thyromegaly LYMPHATICS: no LAN in the neck, no supraclavicular LAN RESPIRATORY: breathing is even & unlabored, BS CTAB CARDIAC: RRR, no murmur,no extra heart sounds, no edema GI: abdomen soft, normal BS, no masses, no tenderness, no hepatomegaly, no splenomegaly EXTREMITIES: able to move all 4 extremities; uses wheelchair PSYCHIATRIC: the patient is alert & oriented to person only, affect & behavior  appropriate  LABS/RADIOLOGY: Labs reviewed: 04/16/15   Urine culture >= 100,000 CFU/ml Klebsiella pneumoniae - was put on Cipro X 10 days 04/13/15  Sodium 139 potassium 3.7 glucose 78 BUN 12 creatinine 0.87 calcium 8.5 albumin 3.04 total bilirubin 0.74 alkaline phosphatase 70SGOT 15 SGPT 8 WBC 6.5 hemoglobin 12.0 MCV 93.1 platelet 210 09/22/14  WBC 6.0 hemoglobin 12.5 hematocrit 37.0 MCV 93.0 platelet 208 sodium 137 potassium 4.3 glucose 83 BUN 17 creatinine 0.98 total bilirubin 1.1 alkaline phosphatase 50 SGOT 21 SGPT 19 total protein 6.1 albumin 3.3 calcium 8.4 cholesterol 146 triglycerides 71 HDL 38 LDL 94 TSH 3.481 09/18/14  vitamin D total 38 vitamin D 3  38 vitamin D 2 <8 vitamin D 33 WBC 6.0 hemoglobin 12.5 hematocrit 37.0 MCV 93.0 sodium 137 potassium 4.3  glucose 83 BUN 17 creatinine 0.98 total bilirubin 1.1 alkaline phosphatase 50 SGOT 21 SGPT 19 total protein 6.1 albumin 3.3 calcium 8.4 cholesterol 146 triglyceride 71 HDL 38 LDL 94 TSH 3.481  ASSESSMENT/PLAN:  Hypertension - well controlled; continue lisinopril 2.5 mg by mouth daily  Depression - mood is stable; Lexapro was discontinued  (GDR attempt)  Constipation - continue senna 1 tab by mouth daily was changed to PRN  Vitamin D deficiency - continue vitamin D 3 2000 units 1 capsule by mouth daily at bedtime  Osteoarthrosis - continue Tylenol 500 mg by mouth twice a day PRN  Protein calorie malnutrition - albumin 3.04;  continue Procel 1 scoop by mouth twice a day  Alzheimer's dementia - stable     Goals of care:   long-term care    [facility],Felder Lebeda, NP [facility] [phone]

## 2015-07-05 ENCOUNTER — Encounter: Payer: Self-pay | Admitting: Adult Health

## 2015-07-05 ENCOUNTER — Non-Acute Institutional Stay (SKILLED_NURSING_FACILITY): Payer: Medicare Other | Admitting: Adult Health

## 2015-07-05 DIAGNOSIS — E46 Unspecified protein-calorie malnutrition: Secondary | ICD-10-CM

## 2015-07-05 DIAGNOSIS — M179 Osteoarthritis of knee, unspecified: Secondary | ICD-10-CM | POA: Diagnosis not present

## 2015-07-05 DIAGNOSIS — F330 Major depressive disorder, recurrent, mild: Secondary | ICD-10-CM

## 2015-07-05 DIAGNOSIS — K5900 Constipation, unspecified: Secondary | ICD-10-CM | POA: Diagnosis not present

## 2015-07-05 DIAGNOSIS — IMO0002 Reserved for concepts with insufficient information to code with codable children: Secondary | ICD-10-CM

## 2015-07-05 DIAGNOSIS — E559 Vitamin D deficiency, unspecified: Secondary | ICD-10-CM

## 2015-07-05 DIAGNOSIS — F0280 Dementia in other diseases classified elsewhere without behavioral disturbance: Secondary | ICD-10-CM

## 2015-07-05 DIAGNOSIS — G309 Alzheimer's disease, unspecified: Secondary | ICD-10-CM

## 2015-07-05 DIAGNOSIS — I10 Essential (primary) hypertension: Secondary | ICD-10-CM | POA: Diagnosis not present

## 2015-07-05 DIAGNOSIS — M1710 Unilateral primary osteoarthritis, unspecified knee: Secondary | ICD-10-CM

## 2015-07-05 NOTE — Progress Notes (Signed)
Patient ID: Mareo Portilla, male   DOB: 1917-01-05, 82 y.o.   MRN: [id number]   DATE:  07/05/15  Facility:  Nursing Home Location:  [facility] Room Number: 1003-1 LEVEL OF CARE:  SNF (31)   Chief Complaint  Patient presents with  . Medical Management of Chronic Issues    Hypertension, depression, constipation, osteoarthrosis, Alzheimer's dementia, protein calorie malnutrition  and vitamin D deficiency    HISTORY OF PRESENT ILLNESS:   This is a 82 year old male who is being seen for a routine visit. He is a long term resident @ [address]. His mood has been stable even with Lexapro being discontinued. His BPs has been well-controlled and currently on Lisinopril.  PAST MEDICAL HISTORY:  Past Medical History  Diagnosis Date  . Chronic kidney disease   . Depression     CURRENT MEDICATIONS: Reviewed per MAR/see medication list    Medication List       This list is accurate as of: 07/05/15  5:20 PM.  Always use your most recent med list.               acetaminophen 500 MG tablet  Commonly known as:  TYLENOL  Take 500 mg by mouth 2 (two) times daily.     BALANCED NUTRITIONAL SHAKE PLS PO  Take by mouth. Mighty shake twice daily with med pass 2 pm, 8 pm     hydrocerin Crea  Apply 1 application topically 2 (two) times daily. To both legs     lisinopril 2.5 MG tablet  Commonly known as:  PRINIVIL,ZESTRIL  Take 2.5 mg by mouth daily. For HTN     loperamide 2 MG capsule  Commonly known as:  IMODIUM  Take 4 mg by mouth once. With initial episode of diarrhea     nystatin cream  Commonly known as:  MYCOSTATIN  Apply 1 application topically 2 (two) times daily as needed for dry skin. Apply to abdominal folds     PROCEL Powd  Take 1 scoop by mouth 2 (two) times daily.     selenium sulfide 2.5 % shampoo  Commonly known as:  SELSUN  Apply 1 application topically daily as needed for irritation. Tuesdays and Fridays     senna 8.6 MG Tabs  tablet  Commonly known as:  SENOKOT  Take 1 tablet by mouth daily as needed. For constipation     SYSTANE 0.4-0.3 % Gel ophthalmic gel  Generic drug:  Polyethyl Glycol-Propyl Glycol  Apply 1 drop to eye 3 (three) times daily.     UNABLE TO FIND  Med Name: Med Pass 60 mL by mouth twice daily for nutritional support     Vitamin D3 2000 units Tabs  Take by mouth daily.     zinc oxide 11.3 % Crea cream  Commonly known as:  BALMEX  Apply 1 application topically. After each incontinent episode each shift        No Known Allergies   REVIEW OF SYSTEMS: unable to obtain due to advance dementia   PHYSICAL EXAMINATION  GENERAL: no acute distress, normal body habitus NECK: supple, trachea midline, no neck masses, no thyroid tenderness, no thyromegaly LYMPHATICS: no LAN in the neck, no supraclavicular LAN RESPIRATORY: breathing is even & unlabored, BS CTAB CARDIAC: RRR, no murmur,no extra heart sounds, no edema GI: abdomen soft, normal BS, no masses, no tenderness, no hepatomegaly, no splenomegaly EXTREMITIES: able to move all 4 extremities; uses wheelchair PSYCHIATRIC: the patient is alert &  oriented to person only, affect & behavior appropriate  LABS/RADIOLOGY: Labs reviewed: 04/16/15   Urine culture >= 100,000 CFU/ml Klebsiella pneumoniae - was put on Cipro X 10 days 04/13/15  Sodium 139 potassium 3.7 glucose 78 BUN 12 creatinine 0.87 calcium 8.5 albumin 3.04 total bilirubin 0.74 alkaline phosphatase 70SGOT 15 SGPT 8 WBC 6.5 hemoglobin 12.0 MCV 93.1 platelet 210 09/22/14  WBC 6.0 hemoglobin 12.5 hematocrit 37.0 MCV 93.0 platelet 208 sodium 137 potassium 4.3 glucose 83 BUN 17 creatinine 0.98 total bilirubin 1.1 alkaline phosphatase 50 SGOT 21 SGPT 19 total protein 6.1 albumin 3.3 calcium 8.4 cholesterol 146 triglycerides 71 HDL 38 LDL 94 TSH 3.481 09/18/14  vitamin D total 38 vitamin D 3  38 vitamin D 2 <8 vitamin D 33 WBC 6.0 hemoglobin 12.5 hematocrit 37.0 MCV 93.0 sodium 137  potassium 4.3 glucose 83 BUN 17 creatinine 0.98 total bilirubin 1.1 alkaline phosphatase 50 SGOT 21 SGPT 19 total protein 6.1 albumin 3.3 calcium 8.4 cholesterol 146 triglyceride 71 HDL 38 LDL 94 TSH 3.481  ASSESSMENT/PLAN:  Hypertension - well controlled; continue lisinopril 2.5 mg by mouth daily; check CBC  Depression - mood is stable; on no medication  Constipation - continue senna 1 tab by mouth daily PRN  Vitamin D deficiency - continue vitamin D 3 2000 units 1 capsule by mouth daily at bedtime  Osteoarthrosis - continue Tylenol 500 mg by mouth twice a day PRN  Protein calorie malnutrition - continue Procel 1 scoop by mouth twice a day; check CMP  Alzheimer's dementia - stable     Goals of care:   long-term care    [facility],Dakota Vanwart, NP [facility] [phone]

## 2015-07-26 ENCOUNTER — Encounter: Payer: Self-pay | Admitting: Internal Medicine

## 2015-07-26 ENCOUNTER — Non-Acute Institutional Stay (SKILLED_NURSING_FACILITY): Payer: Medicare Other | Admitting: Internal Medicine

## 2015-07-26 DIAGNOSIS — H04129 Dry eye syndrome of unspecified lacrimal gland: Secondary | ICD-10-CM | POA: Insufficient documentation

## 2015-07-26 DIAGNOSIS — F0280 Dementia in other diseases classified elsewhere without behavioral disturbance: Secondary | ICD-10-CM | POA: Diagnosis not present

## 2015-07-26 DIAGNOSIS — H16223 Keratoconjunctivitis sicca, not specified as Sjogren's, bilateral: Secondary | ICD-10-CM

## 2015-07-26 DIAGNOSIS — L210 Seborrhea capitis: Secondary | ICD-10-CM | POA: Insufficient documentation

## 2015-07-26 DIAGNOSIS — E559 Vitamin D deficiency, unspecified: Secondary | ICD-10-CM | POA: Diagnosis not present

## 2015-07-26 DIAGNOSIS — G309 Alzheimer's disease, unspecified: Secondary | ICD-10-CM | POA: Diagnosis not present

## 2015-07-26 DIAGNOSIS — F32A Depression, unspecified: Secondary | ICD-10-CM | POA: Insufficient documentation

## 2015-07-26 DIAGNOSIS — I10 Essential (primary) hypertension: Secondary | ICD-10-CM

## 2015-07-26 DIAGNOSIS — F329 Major depressive disorder, single episode, unspecified: Secondary | ICD-10-CM

## 2015-07-26 DIAGNOSIS — I739 Peripheral vascular disease, unspecified: Secondary | ICD-10-CM | POA: Diagnosis not present

## 2015-07-26 NOTE — Progress Notes (Signed)
Patient ID: Barry Castillo, male   DOB: May 21, 1917, 82 y.o.   MRN: [id number]      [facility]   PCP: No primary care provider on file.  Code Status: Full Code  No Known Allergies  Chief Complaint  Patient presents with  . Medical Management of Chronic Issues    Routine Visit     HPI:  82 year old patient is seen for routine visit. He denies any concern. No new concern from staff. BP reading has been stable. Bowel movement has been regular. No falls reported. No new skin concern. No behavioral disturbances reported. He has history of dementia, HTN, CKD, depression and is a high fall risk. He is intermittently confused, incontinent with bowel and bladder and needs assistance with bathing and dressing. He can feed himself. He is hard of hearing, can verbally communicate his needs. Has gained weight.   Review of Systems:  Constitutional: Negative for fever, chills HENT: Negative for headache, congestion Respiratory: Negative for cough, shortness of breath and wheezing.   Cardiovascular: Negative for chest pain, palpitations, leg swelling.  Gastrointestinal: Negative for heartburn, nausea, vomiting, abdominal pain Genitourinary: Negative for dysuria Musculoskeletal: Negative for back pain, falls Skin: Negative for itching, rash.  Neurological: Negative for dizziness Psychiatric/Behavioral: positive for memory loss   Past Medical History  Diagnosis Date  . Chronic kidney disease   . Depression    Medications: Patient's Medications  New Prescriptions   No medications on file  Previous Medications   ACETAMINOPHEN (TYLENOL) 500 MG TABLET    Take 500 mg by mouth 2 (two) times daily.   CHOLECALCIFEROL (VITAMIN D3) 2000 UNITS TABS    Take by mouth daily.   HYDROCERIN (EUCERIN) CREA    Apply 1 application topically 2 (two) times daily. To both legs   LISINOPRIL (PRINIVIL,ZESTRIL) 2.5 MG TABLET    Take 2.5 mg by mouth daily. For HTN   LOPERAMIDE  (IMODIUM) 2 MG CAPSULE    Take 4 mg by mouth once. With initial episode of diarrhea   NUTRITIONAL SUPPLEMENTS (BALANCED NUTRITIONAL SHAKE PLS PO)    Take by mouth. Mighty shake twice daily with med pass 2 pm, 8 pm   NYSTATIN CREAM (MYCOSTATIN)    Apply 1 application topically 2 (two) times daily as needed for dry skin. Apply to abdominal folds   POLYETHYL GLYCOL-PROPYL GLYCOL (SYSTANE) 0.4-0.3 % GEL    Apply 1 drop to eye 3 (three) times daily.   PROTEIN (PROCEL) POWD    Take 1 scoop by mouth 2 (two) times daily.   SELENIUM SULFIDE (SELSUN) 2.5 % SHAMPOO    Apply 1 application topically daily as needed for irritation. Tuesdays and Fridays   ZINC OXIDE (BALMEX) 11.3 % CREA CREAM    Apply 1 application topically. After each incontinent episode each shift  Modified Medications   No medications on file  Discontinued Medications   SENNA (SENOKOT) 8.6 MG TABS TABLET    Take 1 tablet by mouth daily as needed. For constipation   UNABLE TO FIND    Med Name: Med Pass 60 mL by mouth twice daily for nutritional support     Physical Exam Filed Vitals:   07/26/15 1223  BP: 133/71  Pulse: 63  Temp: 96.6 F (35.9 C)  TempSrc: Oral  Resp: 18  Height:  (1.93 m)  Weight: 194 lb (87.998 kg)  SpO2: 95%   Wt Readings from Last 3 Encounters:  07/26/15 194 lb (87.998 kg)  07/05/15 188 lb (85.276 kg)  06/02/15 187 lb 12.8 oz (85.186 kg)   Body mass index is 23.62 kg/(m^2).   General- elderly male, well built, in no acute distress Head- normocephalic, atraumatic Throat- moist mucus membrane Eyes- PERRLA, EOMI, no pallor, no icterus, no discharge, normal conjunctiva, normal sclera Neck- no cervical lymphadenopathy Cardiovascular- normal s1,s2, no murmurs, good radial pulses, has ankle edema Respiratory- bilateral clear to auscultation, no wheeze, no rhonchi, no crackles, no use of accessory muscles Abdomen- bowel sounds present, soft, non tender Musculoskeletal- able to move all 4 extremities,  on wheelchair, chronic stasis changes to both lower legs Neurological- no focal deficit Skin- warm and dry Psychiatry- alert and oriented to person, normal mood and affect    Labs reviewed: CBC Latest Ref Rng 04/13/2015  WBC - 6.5  Hemoglobin 13.5 - 17.5 g/dL 12.0(A)  Hematocrit 41 - 53 % 37(A)  Platelets 150 - 399 K/L 210   CMP Latest Ref Rng 04/13/2015  BUN 4 - 21 mg/dL 12  Creatinine 0.6 - 1.3 mg/dL 0.9  Sodium 161 - 096 mmol/L 139  Potassium 3.4 - 5.3 mmol/L 3.7  Alkaline Phos 25 - 125 U/L 70  AST 14 - 40 U/L 15  ALT 10 - 40 U/L 8(A)    Assessment/Plan  Alzheimer's disease Stable, weight and mood stable. decline anticipated, continue assistance with ADLS, falls precuations, pressure ulcer prophylaxis, encourage out of bed. Monitor po intake and weight. On mechanical soft diet. Check cbc  Dry eyes Continue systane eye drops for now  Seborrhea capitis Continue selenium sulfide shampoo and skin care  HTN Stable bp, continue lisinopril 2.5 mg daily and monitor bp. Check bmp  Vitamin d def Continue vitamin d supplement. Check vitamin d level  Depression Stable mood. Off all medication for now, monitor clinically  PVD Chronic skin changes noted, no skin breakdown, continue skin care  Oneal Grout, MD  [facility] [phone] (Monday-Friday 8 am - 5 pm) [phone] (afterhours)

## 2015-07-28 LAB — HEPATIC FUNCTION PANEL
ALT: 7 U/L — AB (ref 10–40)
AST: 14 U/L (ref 14–40)
Alkaline Phosphatase: 68 U/L (ref 25–125)
Bilirubin, Total: 0.8 mg/dL

## 2015-07-28 LAB — CBC AND DIFFERENTIAL
HCT: 35 % — AB (ref 41–53)
HEMOGLOBIN: 11.5 g/dL — AB (ref 13.5–17.5)
PLATELETS: 206 10*3/uL (ref 150–399)
WBC: 7.1 10*3/mL

## 2015-07-28 LAB — BASIC METABOLIC PANEL
BUN: 16 mg/dL (ref 4–21)
CREATININE: 0.9 mg/dL (ref 0.6–1.3)
Glucose: 80 mg/dL
Potassium: 3.5 mmol/L (ref 3.4–5.3)
Sodium: 142 mmol/L (ref 137–147)

## 2015-08-19 ENCOUNTER — Non-Acute Institutional Stay (SKILLED_NURSING_FACILITY): Payer: Medicare Other | Admitting: Adult Health

## 2015-08-19 ENCOUNTER — Encounter: Payer: Self-pay | Admitting: Adult Health

## 2015-08-19 DIAGNOSIS — F0280 Dementia in other diseases classified elsewhere without behavioral disturbance: Secondary | ICD-10-CM | POA: Diagnosis not present

## 2015-08-19 DIAGNOSIS — F329 Major depressive disorder, single episode, unspecified: Secondary | ICD-10-CM | POA: Diagnosis not present

## 2015-08-19 DIAGNOSIS — E559 Vitamin D deficiency, unspecified: Secondary | ICD-10-CM | POA: Diagnosis not present

## 2015-08-19 DIAGNOSIS — K5900 Constipation, unspecified: Secondary | ICD-10-CM | POA: Diagnosis not present

## 2015-08-19 DIAGNOSIS — I10 Essential (primary) hypertension: Secondary | ICD-10-CM

## 2015-08-19 DIAGNOSIS — F32A Depression, unspecified: Secondary | ICD-10-CM

## 2015-08-19 DIAGNOSIS — M1710 Unilateral primary osteoarthritis, unspecified knee: Secondary | ICD-10-CM

## 2015-08-19 DIAGNOSIS — M179 Osteoarthritis of knee, unspecified: Secondary | ICD-10-CM | POA: Diagnosis not present

## 2015-08-19 DIAGNOSIS — E46 Unspecified protein-calorie malnutrition: Secondary | ICD-10-CM | POA: Diagnosis not present

## 2015-08-19 DIAGNOSIS — G309 Alzheimer's disease, unspecified: Secondary | ICD-10-CM | POA: Diagnosis not present

## 2015-08-19 DIAGNOSIS — I739 Peripheral vascular disease, unspecified: Secondary | ICD-10-CM | POA: Diagnosis not present

## 2015-08-19 DIAGNOSIS — IMO0002 Reserved for concepts with insufficient information to code with codable children: Secondary | ICD-10-CM

## 2015-08-19 NOTE — Progress Notes (Signed)
Patient ID: Barry Castillo, male   DOB: 09/05/1916, 82 y.o.   MRN: [id number]   DATE:    08/19/15  Facility:  Nursing Home Location:  [facility] Room Number: 1003-1 LEVEL OF CARE:  SNF (31)   Chief Complaint  Patient presents with  . Medical Management of Chronic Issues    Hypertension, depression, constipation, osteoarthrosis, Alzheimer's dementia, protein calorie malnutrition  and vitamin D deficiency    HISTORY OF PRESENT ILLNESS:   This is a 82 year old male who is being seen for a routine visit. He is a long term resident @ [address]. Latest albumin  2.99. He currently takes Procel BID. Latest Vitamin D level 49.19, normal. He is on Vitamin D supplementation. His mood is stable without antidepressant.  PAST MEDICAL HISTORY:  Past Medical History  Diagnosis Date  . Chronic kidney disease   . Depression     CURRENT MEDICATIONS: Reviewed per MAR/see medication list    Medication List       This list is accurate as of: 08/19/15 11:59 PM.  Always use your most recent med list.               acetaminophen 500 MG tablet  Commonly known as:  TYLENOL  Take 500 mg by mouth 2 (two) times daily.     BALANCED NUTRITIONAL SHAKE PLS PO  Take by mouth. Mighty Shake twice daily with MedPass at 2 pm and 8 pm     hydrocerin Crea  Apply 1 application topically 2 (two) times daily. To both legs     lisinopril 2.5 MG tablet  Commonly known as:  PRINIVIL,ZESTRIL  Take 2.5 mg by mouth daily. For HTN     loperamide 2 MG capsule  Commonly known as:  IMODIUM  Take 4 mg by mouth once. With initial episode of diarrhea, then take one capsule = 2 mg with each episode of diarrhea, not to exceed 16 mg in a 24-hour period     nystatin cream  Commonly known as:  MYCOSTATIN  Apply 1 application topically 2 (two) times daily as needed for dry skin. Apply to abdominal folds     PROCEL Powd  Take 1 scoop by mouth 2 (two) times daily.     selenium sulfide 2.5 %  shampoo  Commonly known as:  SELSUN  Apply 1 application topically daily as needed for irritation. Tuesdays and Fridays     sennosides-docusate sodium 8.6-50 MG tablet  Commonly known as:  SENOKOT-S  Take 1 tablet by mouth daily as needed for constipation.     SYSTANE 0.4-0.3 % Gel ophthalmic gel  Generic drug:  Polyethyl Glycol-Propyl Glycol  Apply 1 drop to eye 3 (three) times daily.     UNABLE TO FIND  Med Name: MedPass 60 mL PO BID for nutritional support     VITAMIN D-3 PO  Take 2,000 Units by mouth at bedtime.     zinc oxide 11.3 % Crea cream  Commonly known as:  BALMEX  Apply 1 application topically. After each incontinent episode each shift        No Known Allergies   REVIEW OF SYSTEMS: unable to obtain due to advance dementia   PHYSICAL EXAMINATION  GENERAL: no acute distress, normal body habitus SKIN:  Skin is warm and dry. EARS: HOH NECK: supple, trachea midline, no neck masses, no thyroid tenderness, no thyromegaly LYMPHATICS: no LAN in the neck, no supraclavicular LAN RESPIRATORY: breathing is even & unlabored, BS  CTAB CARDIAC: RRR, no murmur,no extra heart sounds, no edema GI: abdomen soft, normal BS, no masses, no tenderness, no hepatomegaly, no splenomegaly EXTREMITIES: able to move all 4 extremities with BLE generalized weakness; uses wheelchair PSYCHIATRIC: the patient is alert & oriented to person only, affect & behavior appropriate  LABS/RADIOLOGY: Labs reviewed: Basic Metabolic Panel:  Recent Labs  60/45/40 07/28/15  NA 139 142  K 3.7 3.5  BUN 12 16  CREATININE 0.9 0.9   Liver Function Tests:  Recent Labs  04/13/15 07/28/15  AST 15 14  ALT 8* 7*  ALKPHOS 70 68    CBC:  Recent Labs  04/13/15 07/28/15  WBC 6.5 7.1  HGB 12.0* 11.5*  HCT 37* 35*  PLT 210 206   Lipid Panel:  Recent Labs  09/22/14  HDL 38    ASSESSMENT/PLAN:  PVD - keep skin clean andr; skin integrity is intact  Hypertension - well controlled; continue  lisinopril 2.5 mg by mouth daily  Depression - mood is stable; on no medication  Constipation - continue senna 1 tab by mouth daily PRN  Vitamin D deficiency - continue vitamin D3 2000 units 1 capsule by mouth daily at bedtime  Osteoarthrosis - continue Tylenol 500 mg by mouth twice a day PRN  Protein calorie malnutrition - albumin 2.99; continue Procel 1 scoop by mouth twice a day; check CMP  Alzheimer's dementia - advanced; continue supportive care     Goals of care:   long-term care    [facility], NP [facility] [phone]

## 2015-08-20 ENCOUNTER — Encounter: Payer: Self-pay | Admitting: Adult Health

## 2015-09-21 ENCOUNTER — Non-Acute Institutional Stay (SKILLED_NURSING_FACILITY): Payer: Medicare Other | Admitting: Adult Health

## 2015-09-21 ENCOUNTER — Encounter: Payer: Self-pay | Admitting: Adult Health

## 2015-09-21 DIAGNOSIS — E739 Lactose intolerance, unspecified: Secondary | ICD-10-CM

## 2015-09-21 DIAGNOSIS — M1710 Unilateral primary osteoarthritis, unspecified knee: Secondary | ICD-10-CM

## 2015-09-21 DIAGNOSIS — I10 Essential (primary) hypertension: Secondary | ICD-10-CM | POA: Diagnosis not present

## 2015-09-21 DIAGNOSIS — E559 Vitamin D deficiency, unspecified: Secondary | ICD-10-CM

## 2015-09-21 DIAGNOSIS — K5900 Constipation, unspecified: Secondary | ICD-10-CM | POA: Diagnosis not present

## 2015-09-21 DIAGNOSIS — F0280 Dementia in other diseases classified elsewhere without behavioral disturbance: Secondary | ICD-10-CM

## 2015-09-21 DIAGNOSIS — G309 Alzheimer's disease, unspecified: Secondary | ICD-10-CM

## 2015-09-21 DIAGNOSIS — M179 Osteoarthritis of knee, unspecified: Secondary | ICD-10-CM

## 2015-09-21 DIAGNOSIS — E46 Unspecified protein-calorie malnutrition: Secondary | ICD-10-CM

## 2015-09-21 DIAGNOSIS — IMO0002 Reserved for concepts with insufficient information to code with codable children: Secondary | ICD-10-CM

## 2015-09-21 NOTE — Progress Notes (Signed)
Patient ID: Barry Castillo, male   DOB: 12/17/1916, 82 y.o.   MRN: [id number]   DATE:    09/21/15  Facility:  Nursing Home Location:  [facility] Room Number: 1003-1 LEVEL OF CARE:  SNF (31)   Chief Complaint  Patient presents with  . Medical Management of Chronic Issues    Hypertension, constipation, osteoarthrosis, Lactose intolerance, Alzheimer's dementia, protein calorie malnutrition  and vitamin D deficiency    HISTORY OF PRESENT ILLNESS:   This is a 82 year old male who is being seen for a routine visit. [facility] [address] His hypertension has been well-controlled - BP review 132/76, 126/55 and 144/80. He has been noted to have diarrhea whenever he takes mighty shake. He is thought to be lactose intolerance. Medpass has been increased to 90 ml TID for nutritional supplementation.  PAST MEDICAL HISTORY:  Past Medical History  Diagnosis Date  . Chronic kidney disease   . Depression     Chronic  . Benign essential HTN   . Vitamin D deficiency   . PVD (peripheral vascular disease) (HCC)   . Osteoarthrosis involving lower leg   . Protein calorie malnutrition (HCC)   . Alzheimer's dementia without behavioral disturbance   . Constipation   . Klebsiella cystitis   . Anemia of chronic disease   . Seborrhea capitis     CURRENT MEDICATIONS: Reviewed per MAR/see medication list    Medication List       This list is accurate as of: 09/21/15 11:59 PM.  Always use your most recent med list.               acetaminophen 500 MG tablet  Commonly known as:  TYLENOL  Take 500 mg by mouth 2 (two) times daily.     hydrocerin Crea  Apply 1 application topically 2 (two) times daily. To both legs     lisinopril 2.5 MG tablet  Commonly known as:  PRINIVIL,ZESTRIL  Take 2.5 mg by mouth daily. For HTN     loperamide 2 MG capsule  Commonly known as:  IMODIUM  Take 4 mg by mouth once. With initial episode of diarrhea, then take one  capsule = 2 mg with each episode of diarrhea, not to exceed 16 mg in a 24-hour period     nystatin cream  Commonly known as:  MYCOSTATIN  Apply 1 application topically 2 (two) times daily as needed for dry skin. Apply to abdominal folds     PROCEL Powd  Take 1 scoop by mouth 2 (two) times daily.     selenium sulfide 2.5 % shampoo  Commonly known as:  SELSUN  Apply 1 application topically. Tuesdays and Fridays     sennosides-docusate sodium 8.6-50 MG tablet  Commonly known as:  SENOKOT-S  Take 1 tablet by mouth daily as needed for constipation.     SYSTANE 0.4-0.3 % Gel ophthalmic gel  Generic drug:  Polyethyl Glycol-Propyl Glycol  Apply 1 drop to eye 3 (three) times daily.     UNABLE TO FIND  Med Name: MedPass 90 mL PO TID for nutritional support     VITAMIN D-3 PO  Take 2,000 Units by mouth at bedtime.     zinc oxide 11.3 % Crea cream  Commonly known as:  BALMEX  Apply 1 application topically. After each incontinent episode each shift        No Known Allergies   REVIEW OF SYSTEMS: unable to obtain  due to advance dementia   PHYSICAL EXAMINATION  GENERAL: no acute distress, normal body habitus SKIN:  Skin is warm and dry. EARS: HOH NECK: supple, trachea midline, no neck masses, no thyroid tenderness, no thyromegaly LYMPHATICS: no LAN in the neck, no supraclavicular LAN RESPIRATORY: breathing is even & unlabored, BS CTAB CARDIAC: RRR, no murmur,no extra heart sounds, no edema GI: abdomen soft, normal BS, no masses, no tenderness, no hepatomegaly, no splenomegaly EXTREMITIES: able to move all 4 extremities with BLE generalized weakness; uses wheelchair PSYCHIATRIC: the patient is alert & oriented to person only, affect & behavior appropriate  LABS/RADIOLOGY: Labs reviewed: Basic Metabolic Panel:  Recent Labs  95/62/13 07/28/15   NA 139 142   K 3.7 3.5   BUN 12 16   CREATININE 0.9 0.9    Liver Function Tests:  Recent Labs  04/13/15 07/28/15  AST 15 14   ALT 8* 7*  ALKPHOS 70 68    CBC:  Recent Labs  04/13/15 07/28/15   WBC 6.5 7.1   NEUTROABS  --   --    HGB 12.0* 11.5*   HCT 37* 35*   PLT 210 206     ASSESSMENT/PLAN:  Hypertension - well controlled; continue lisinopril 2.5 mg by mouth daily; check BMP  Constipation - continue senna 1 tab by mouth daily PRN  Vitamin D deficiency - continue vitamin D3 2000 units 1 capsule by mouth daily at bedtime  Osteoarthrosis - continue Tylenol 500 mg by mouth twice a day PRN  Protein calorie malnutrition - continue Procel 1 scoop by mouth twice a day and Med pas 90 ml TID; check CBC  Alzheimer's dementia - advanced; continue supportive care  Lactose intolerance  - mighty shake was discontinued    Goals of care:   long-term care    Kenard Gower, NP [facility] [phone]

## 2015-09-23 LAB — CBC AND DIFFERENTIAL
HCT: 36 % — AB (ref 41–53)
HEMOGLOBIN: 11.6 g/dL — AB (ref 13.5–17.5)
NEUTROS ABS: 3 /uL
PLATELETS: 243 10*3/uL (ref 150–399)
WBC: 6.4 10^3/mL

## 2015-09-23 LAB — BASIC METABOLIC PANEL
BUN: 17 mg/dL (ref 4–21)
CREATININE: 1 mg/dL (ref 0.6–1.3)
Glucose: 79 mg/dL
Potassium: 4.1 mmol/L (ref 3.4–5.3)
Sodium: 140 mmol/L (ref 137–147)

## 2015-10-25 ENCOUNTER — Non-Acute Institutional Stay (SKILLED_NURSING_FACILITY): Payer: Medicare Other | Admitting: Adult Health

## 2015-10-25 ENCOUNTER — Encounter: Payer: Self-pay | Admitting: Adult Health

## 2015-10-25 DIAGNOSIS — F0280 Dementia in other diseases classified elsewhere without behavioral disturbance: Secondary | ICD-10-CM

## 2015-10-25 DIAGNOSIS — E46 Unspecified protein-calorie malnutrition: Secondary | ICD-10-CM | POA: Diagnosis not present

## 2015-10-25 DIAGNOSIS — M1710 Unilateral primary osteoarthritis, unspecified knee: Secondary | ICD-10-CM

## 2015-10-25 DIAGNOSIS — E559 Vitamin D deficiency, unspecified: Secondary | ICD-10-CM

## 2015-10-25 DIAGNOSIS — K5900 Constipation, unspecified: Secondary | ICD-10-CM | POA: Diagnosis not present

## 2015-10-25 DIAGNOSIS — M179 Osteoarthritis of knee, unspecified: Secondary | ICD-10-CM

## 2015-10-25 DIAGNOSIS — I10 Essential (primary) hypertension: Secondary | ICD-10-CM

## 2015-10-25 DIAGNOSIS — G309 Alzheimer's disease, unspecified: Secondary | ICD-10-CM | POA: Diagnosis not present

## 2015-10-25 DIAGNOSIS — IMO0002 Reserved for concepts with insufficient information to code with codable children: Secondary | ICD-10-CM

## 2015-10-25 NOTE — Progress Notes (Signed)
Patient ID: Barry Castillo, male   DOB: 04/21/1917, 82 y.o.   MRN: [id number]    DATE:  10/25/2015   MRN:  [id number]  BIRTHDAY: 07/14/1916  Facility:  Nursing Home Location:  [facility]  Nursing Home Room Number: 1003-1  LEVEL OF CARE:  SNF (31)  Contact Information    Name Relation Home Work Mobile   Sapp,Elizabeth Niece [phone]     Lloyd Hugerimberlake,Paul Nephew   [phone]       Code Status History    This patient does not have a recorded code status. Please follow your organizational policy for patients in this situation.       Chief Complaint  Patient presents with  . Medical Management of Chronic Issues    HISTORY OF PRESENT ILLNESS:  This is a 82 year old male who is being seen for a routine visit. He has been stable for the past month. He has advanced dementia. He has not verbalized any pain and currently on Tylenol PRN for osteoarthrosis. No episode of diarrhea has been noted since mighty shake has been discontinued.  PAST MEDICAL HISTORY:  Past Medical History  Diagnosis Date  . Chronic kidney disease   . Depression     Chronic  . Benign essential HTN   . Vitamin D deficiency   . PVD (peripheral vascular disease) (HCC)   . Osteoarthrosis involving lower leg   . Protein calorie malnutrition (HCC)   . Alzheimer's dementia without behavioral disturbance   . Constipation   . Klebsiella cystitis   . Anemia of chronic disease   . Seborrhea capitis      CURRENT MEDICATIONS: Reviewed  Patient's Medications  New Prescriptions   No medications on file  Previous Medications   ACETAMINOPHEN (TYLENOL) 500 MG TABLET    Take 500 mg by mouth 2 (two) times daily.   CHOLECALCIFEROL (VITAMIN D-3 PO)    Take 2,000 Units by mouth at bedtime.   HYDROCERIN (EUCERIN) CREA    Apply 1 application topically 2 (two) times daily. To both legs   LISINOPRIL (PRINIVIL,ZESTRIL) 2.5 MG TABLET    Take 2.5 mg by mouth daily. For HTN   LOPERAMIDE (IMODIUM) 2 MG  CAPSULE    Take 4 mg by mouth once. With initial episode of diarrhea, then take one capsule = 2 mg with each episode of diarrhea, not to exceed 16 mg in a 24-hour period   NYSTATIN CREAM (MYCOSTATIN)    Apply 1 application topically 2 (two) times daily as needed for dry skin. Apply to abdominal folds   POLYETHYL GLYCOL-PROPYL GLYCOL (SYSTANE) 0.4-0.3 % GEL    Apply 1 drop to eye 3 (three) times daily.   PROTEIN (PROCEL) POWD    Take 1 scoop by mouth 2 (two) times daily.   SELENIUM SULFIDE (SELSUN) 2.5 % SHAMPOO    Apply 1 application topically. Tuesdays and Fridays   SENNOSIDES-DOCUSATE SODIUM (SENOKOT-S) 8.6-50 MG TABLET    Take 1 tablet by mouth daily as needed for constipation.   UNABLE TO FIND    Med Name: MedPass 90 mL PO TID for nutritional support   ZINC OXIDE (BALMEX) 11.3 % CREA CREAM    Apply 1 application topically. After each incontinent episode each shift  Modified Medications   No medications on file  Discontinued Medications   No medications on file     No Known Allergies   REVIEW OF SYSTEMS:  Unable to obtain die to advanced dementia   PHYSICAL EXAMINATION  GENERAL APPEARANCE:  Well nourished. In no acute distress. Normal body habitus SKIN:  Skin is warm and dry. There are no suspicious lesions or rash HEAD: Normal in size and contour. No evidence of trauma EYES: Lids open and close normally. No blepharitis, entropion or ectropion. PERRL. Conjunctivae are clear and sclerae are white. Lenses are without opacity EARS: Pinnae are normal. Patient hears normal voice tunes of the examiner MOUTH and THROAT: Lips are without lesions. Oral mucosa is moist and without lesions. Tongue is normal in shape, size, and color and without lesions NECK: supple, trachea midline, no neck masses, no thyroid tenderness, no thyromegaly LYMPHATICS: no LAN in the neck, no supraclavicular LAN RESPIRATORY: breathing is even & unlabored, BS CTAB CARDIAC: RRR, no murmur,no extra heart sounds, no  edema GI: abdomen soft, normal BS, no masses, no tenderness, no hepatomegaly, no splenomegaly EXTREMITIES:  Able to move X 4 extremities; BLE generalized weakness PSYCHIATRIC: Alert to self and disoriented to time and place. Affect and behavior are appropriate  LABS/RADIOLOGY: Labs reviewed: Basic Metabolic Panel:  Recent Labs  14/78/29 07/28/15 09/23/15  NA 139 142 140  K 3.7 3.5 4.1  BUN CREATININE 0.9 0.9 1.0   Liver Function Tests:  Recent Labs  04/13/15 07/28/15  AST 15 14  ALT 8* 7*  ALKPHOS 70 68   CBC:  Recent Labs  04/13/15 07/28/15 09/23/15  WBC 6.5 7.1 6.4  NEUTROABS  --   --  3  HGB 12.0* 11.5* 11.6*  HCT 37* 35* 36*  PLT 210 206 243    ASSESSMENT/PLAN:  Alzheimer's dementia - advanced; continue supportive care  Osteoarthrosis - continue Tylenol 500 mg by mouth twice a day PRN  Hypertension - well controlled; continue lisinopril 2.5 mg by mouth daily  Constipation - continue senna 1 tab by mouth daily PRN  Vitamin D deficiency - continue vitamin D3 2000 units 1 capsule by mouth daily at bedtime  Protein calorie malnutrition - continue Procel 1 scoop by mouth twice a day and Med pas 90 ml TID    Goals of care:  Long-term care     Kenard Gower, NP [facility] [phone]

## 2015-11-23 ENCOUNTER — Non-Acute Institutional Stay (SKILLED_NURSING_FACILITY): Payer: Medicare Other | Admitting: Adult Health

## 2015-11-23 ENCOUNTER — Encounter: Payer: Self-pay | Admitting: Adult Health

## 2015-11-23 DIAGNOSIS — E559 Vitamin D deficiency, unspecified: Secondary | ICD-10-CM

## 2015-11-23 DIAGNOSIS — I10 Essential (primary) hypertension: Secondary | ICD-10-CM | POA: Diagnosis not present

## 2015-11-23 DIAGNOSIS — M179 Osteoarthritis of knee, unspecified: Secondary | ICD-10-CM | POA: Diagnosis not present

## 2015-11-23 DIAGNOSIS — H1013 Acute atopic conjunctivitis, bilateral: Secondary | ICD-10-CM

## 2015-11-23 DIAGNOSIS — M1710 Unilateral primary osteoarthritis, unspecified knee: Secondary | ICD-10-CM

## 2015-11-23 DIAGNOSIS — E46 Unspecified protein-calorie malnutrition: Secondary | ICD-10-CM

## 2015-11-23 DIAGNOSIS — G309 Alzheimer's disease, unspecified: Secondary | ICD-10-CM | POA: Diagnosis not present

## 2015-11-23 DIAGNOSIS — IMO0002 Reserved for concepts with insufficient information to code with codable children: Secondary | ICD-10-CM

## 2015-11-23 DIAGNOSIS — F0280 Dementia in other diseases classified elsewhere without behavioral disturbance: Secondary | ICD-10-CM

## 2015-11-23 NOTE — Progress Notes (Signed)
Patient ID: Barry Castillo, male   DOB: 30-Aug-1916, 82 y.o.   MRN: [id number]    DATE:  11/23/2015   MRN:  [id number]  BIRTHDAY: 03-20-1917  Facility:  Nursing Home Location:  [facility]  Nursing Home Room Number: 1003-1  LEVEL OF CARE:  SNF (31)  Contact Information    Name Relation Home Work Mobile   Sapp,Elizabeth Niece [phone]     Tarren, Velardi   [phone]       Code Status History    This patient does not have a recorded code status. Please follow your organizational policy for patients in this situation.       Chief Complaint  Patient presents with  . Medical Management of Chronic Issues    HISTORY OF PRESENT ILLNESS:  This is a 82 year old male who is being seen for a routine visit. He is a long-term care resident of [facility].  Noted resident to have erythematous bilateral conjunctivae. No complaints of eye pain nor discharge noted.  PAST MEDICAL HISTORY:  Past Medical History  Diagnosis Date  . Chronic kidney disease   . Depression     Chronic  . Benign essential HTN   . Vitamin D deficiency   . PVD (peripheral vascular disease) (HCC)   . Osteoarthrosis involving lower leg   . Protein calorie malnutrition (HCC)   . Alzheimer's dementia without behavioral disturbance   . Constipation   . Klebsiella cystitis   . Anemia of chronic disease   . Seborrhea capitis      CURRENT MEDICATIONS: Reviewed  Patient's Medications  New Prescriptions   No medications on file  Previous Medications   ACETAMINOPHEN (TYLENOL) 500 MG TABLET    Take 500 mg by mouth 2 (two) times daily.   CHOLECALCIFEROL (VITAMIN D-3 PO)    Take 2,000 Units by mouth at bedtime.   HYDROCERIN (EUCERIN) CREA    Apply 1 application topically 2 (two) times daily. To both legs   LISINOPRIL (PRINIVIL,ZESTRIL) 2.5 MG TABLET    Take 2.5 mg by mouth daily. For HTN   LOPERAMIDE (IMODIUM) 2 MG CAPSULE    Take 4 mg by mouth once. With initial episode of diarrhea,  then take one capsule = 2 mg with each episode of diarrhea, not to exceed 16 mg in a 24-hour period   NYSTATIN CREAM (MYCOSTATIN)    Apply 1 application topically 2 (two) times daily as needed for dry skin. Apply to abdominal folds   POLYETHYL GLYCOL-PROPYL GLYCOL (SYSTANE) 0.4-0.3 % GEL    Apply 1 drop to eye 3 (three) times daily.   PROTEIN (PROCEL) POWD    Take 1 scoop by mouth 2 (two) times daily.   SELENIUM SULFIDE (SELSUN) 2.5 % SHAMPOO    Apply 1 application topically. Tuesdays and Fridays   SENNOSIDES-DOCUSATE SODIUM (SENOKOT-S) 8.6-50 MG TABLET    Take 1 tablet by mouth daily as needed for constipation.   UNABLE TO FIND    Med Name: MedPass 90 mL PO TID for nutritional support   ZINC OXIDE (BALMEX) 11.3 % CREA CREAM    Apply 1 application topically. After each incontinent episode each shift  Modified Medications   No medications on file  Discontinued Medications   No medications on file     No Known Allergies   REVIEW OF SYSTEMS:  Unable to obtain die to advanced dementia   PHYSICAL EXAMINATION  GENERAL APPEARANCE: Well nourished. In no acute distress. Normal body habitus SKIN:  Skin is  warm and dry. There are no suspicious lesions or rash HEAD: Normal in size and contour. No evidence of trauma EYES: Lids open and close normally. No blepharitis, entropion or ectropion. PERRL. Conjunctivae are erythematous. EARS: Pinnae are normal. Patient hears normal voice tunes of the examiner MOUTH and THROAT: Lips are without lesions. Oral mucosa is moist and without lesions. Tongue is normal in shape, size, and color and without lesions NECK: supple, trachea midline, no neck masses, no thyroid tenderness, no thyromegaly LYMPHATICS: no LAN in the neck, no supraclavicular LAN RESPIRATORY: breathing is even & unlabored, BS CTAB CARDIAC: RRR, no murmur,no extra heart sounds, no edema GI: abdomen soft, normal BS, no masses, no tenderness, no hepatomegaly, no splenomegaly EXTREMITIES:  Able  to move X 4 extremities; BLE generalized weakness PSYCHIATRIC: Alert to self and disoriented to time and place. Affect and behavior are appropriate  LABS/RADIOLOGY: Labs reviewed: Basic Metabolic Panel:  Recent Labs  16/03/9609/25/16 07/28/15 09/23/15  NA 139 142 140  K 3.7 3.5 4.1  BUN 12 16 17   CREATININE 0.9 0.9 1.0   Liver Function Tests:  Recent Labs  04/13/15 07/28/15  AST 15 14  ALT 8* 7*  ALKPHOS 70 68   CBC:  Recent Labs  04/13/15 07/28/15 09/23/15  WBC 6.5 7.1 6.4  NEUTROABS  --   --  3  HGB 12.0* 11.5* 11.6*  HCT 37* 35* 36*  PLT 210 206 243    ASSESSMENT/PLAN:  Allergic conjunctivitis, OU - start Patanol 0.1 % opthalmic gtts instill 1 gtt to both eyes BID X 2 weeks  Hypertension - well controlled; continue lisinopril 2.5 mg by mouth daily; check CMP  Protein calorie malnutrition - continue Procel 1 scoop by mouth twice a day and Med pas 90 ml TID  Alzheimer's dementia - advanced; continue supportive care; check CBC and tsh  Osteoarthrosis - continue Tylenol 500 mg by mouth twice a day PRN  Vitamin D deficiency - continue vitamin D3 2000 units 1 capsule by mouth daily at bedtime     Goals of care:  Long-term care     Kenard GowerMonina Medina-Vargas, NP [facility] [phone]

## 2015-11-24 LAB — CBC AND DIFFERENTIAL
HCT: 38 % — AB (ref 41–53)
Hemoglobin: 12.3 g/dL — AB (ref 13.5–17.5)
PLATELETS: 229 10*3/uL (ref 150–399)
WBC: 6.5 10^3/mL

## 2015-11-24 LAB — BASIC METABOLIC PANEL
BUN: 18 mg/dL (ref 4–21)
CREATININE: 1 mg/dL (ref 0.6–1.3)
Glucose: 86 mg/dL
Potassium: 4.5 mmol/L (ref 3.4–5.3)
SODIUM: 139 mmol/L (ref 137–147)

## 2015-11-24 LAB — HEPATIC FUNCTION PANEL
ALK PHOS: 70 U/L (ref 25–125)
ALT: 10 U/L (ref 10–40)
AST: 15 U/L (ref 14–40)
Bilirubin, Total: 0.8 mg/dL

## 2015-11-24 LAB — TSH: TSH: 3.74 u[IU]/mL (ref 0.41–5.90)

## 2016-01-05 ENCOUNTER — Non-Acute Institutional Stay (SKILLED_NURSING_FACILITY): Payer: Medicare Other | Admitting: Internal Medicine

## 2016-01-05 ENCOUNTER — Encounter: Payer: Self-pay | Admitting: Internal Medicine

## 2016-01-05 DIAGNOSIS — I10 Essential (primary) hypertension: Secondary | ICD-10-CM

## 2016-01-05 DIAGNOSIS — IMO0002 Reserved for concepts with insufficient information to code with codable children: Secondary | ICD-10-CM

## 2016-01-05 DIAGNOSIS — K5900 Constipation, unspecified: Secondary | ICD-10-CM | POA: Diagnosis not present

## 2016-01-05 DIAGNOSIS — G309 Alzheimer's disease, unspecified: Secondary | ICD-10-CM

## 2016-01-05 DIAGNOSIS — M179 Osteoarthritis of knee, unspecified: Secondary | ICD-10-CM

## 2016-01-05 DIAGNOSIS — M1710 Unilateral primary osteoarthritis, unspecified knee: Secondary | ICD-10-CM

## 2016-01-05 DIAGNOSIS — F0280 Dementia in other diseases classified elsewhere without behavioral disturbance: Secondary | ICD-10-CM

## 2016-01-05 NOTE — Progress Notes (Signed)
Patient ID: Barry Castillo, male   DOB: 07/24/1916, 82 y.o.   MRN: [id number]      [facility]   PCP: Oneal GroutPANDEY, Beryle Bagsby, MD  Code Status: Full Code  No Known Allergies  Chief Complaint  Patient presents with  . Medical Management of Chronic Issues    Routine Visit     HPI:  82 year old patient is seen for routine visit. He denies any concern. No new concern from staff. He feeds himself. He is out of bed daily. He has been complaint with his medications. He is hard of hearing, can verbally communicate his needs.    Review of Systems:  Constitutional: Negative for fever, chills HENT: Negative for headache, congestion Respiratory: Negative for cough, shortness of breath and wheezing.   Cardiovascular: Negative for chest pain, palpitations, leg swelling.  Gastrointestinal: Negative for heartburn, nausea, vomiting, abdominal pain Genitourinary: Negative for dysuria Musculoskeletal: Negative for back pain, falls Skin: Negative for itching, rash.  Neurological: Negative for dizziness Psychiatric/Behavioral: positive for memory loss   Past Medical History  Diagnosis Date  . Chronic kidney disease   . Depression     Chronic  . Benign essential HTN   . Vitamin D deficiency   . PVD (peripheral vascular disease) (HCC)   . Osteoarthrosis involving lower leg   . Protein calorie malnutrition (HCC)   . Alzheimer's dementia without behavioral disturbance   . Constipation   . Klebsiella cystitis   . Anemia of chronic disease   . Seborrhea capitis    Medications: Patient's Medications  New Prescriptions   No medications on file  Previous Medications   ACETAMINOPHEN (TYLENOL) 500 MG TABLET    Take 500 mg by mouth 2 (two) times daily as needed.    CHOLECALCIFEROL (VITAMIN D-3 PO)    Take 2,000 Units by mouth at bedtime.   LISINOPRIL (PRINIVIL,ZESTRIL) 2.5 MG TABLET    Take 2.5 mg by mouth daily. For HTN   LOPERAMIDE (IMODIUM) 2 MG CAPSULE    Take 4  mg by mouth once. With initial episode of diarrhea, then take one capsule = 2 mg with each episode of diarrhea, not to exceed 16 mg in a 24-hour period   POLYETHYL GLYCOL-PROPYL GLYCOL (SYSTANE) 0.4-0.3 % GEL    Apply 1 drop to eye 3 (three) times daily.   PROTEIN (PROCEL) POWD    Take 1 scoop by mouth 2 (two) times daily.   SENNOSIDES-DOCUSATE SODIUM (SENOKOT-S) 8.6-50 MG TABLET    Take 1 tablet by mouth daily as needed for constipation.   UNABLE TO FIND    Med Name: MedPass 90 mL PO TID for nutritional support  Modified Medications   No medications on file  Discontinued Medications   HYDROCERIN (EUCERIN) CREA    Apply 1 application topically 2 (two) times daily. To both legs   NYSTATIN CREAM (MYCOSTATIN)    Apply 1 application topically 2 (two) times daily as needed for dry skin. Apply to abdominal folds   SELENIUM SULFIDE (SELSUN) 2.5 % SHAMPOO    Apply 1 application topically. Tuesdays and Fridays   ZINC OXIDE (BALMEX) 11.3 % CREA CREAM    Apply 1 application topically. After each incontinent episode each shift     Physical Exam Filed Vitals:   01/05/16 1434  BP: 129/72  Pulse: 63  Temp: 96.8 F (36 C)  TempSrc: Oral  Resp: 20  Height: 6\' 4"  (1.93 m)  Weight: 190 lb 6.4 oz (86.365 kg)  SpO2:  99%   Wt Readings from Last 3 Encounters:  01/05/16 190 lb 6.4 oz (86.365 kg)  11/23/15 189 lb (85.73 kg)  10/25/15 191 lb 3.2 oz (86.728 kg)   Body mass index is 23.19 kg/(m^2).   General- elderly male, well built, in no acute distress Head- normocephalic, atraumatic Throat- moist mucus membrane Eyes- PERRLA, EOMI, no pallor, no icterus, no discharge Neck- no cervical lymphadenopathy Cardiovascular- normal s1,s2, no murmur, good radial pulses, has ankle edema Respiratory- bilateral clear to auscultation, no wheeze, no rhonchi, no crackles, no use of accessory muscles Abdomen- bowel sounds present, soft, non tender Musculoskeletal- able to move all 4 extremities, on wheelchair,  chronic stasis changes to both lower legs Neurological- alert and oriented to person only  Skin- warm and dry Psychiatry- normal mood and affect    Labs reviewed: CBC Latest Ref Rng 11/24/2015 09/23/2015 07/28/2015  WBC - 6.5 6.4 7.1  Hemoglobin 13.5 - 17.5 g/dL 12.3(A) 11.6(A) 11.5(A)  Hematocrit 41 - 53 % 38(A) 36(A) 35(A)  Platelets 150 - 399 K/L 229 243 206   CMP Latest Ref Rng 11/24/2015 09/23/2015 07/28/2015  BUN 4 - 21 mg/dL Creatinine 0.6 - 1.3 mg/dL 1.0 1.0 0.9  Sodium 295 - 147 mmol/L 139 140 142  Potassium 3.4 - 5.3 mmol/L 4.5 4.1 3.5  Alkaline Phos 25 - 125 U/L 70 - 68  AST 14 - 40 U/L 15 - 14  ALT 10 - 40 U/L 10 - 7(A)    Assessment/Plan  HTN Stable bp overall. Discontinue lisinopril 2.5 mg daily and monitor bp.   OA Continue tylenol 500 mg bid prn  Chronic constipation Continue senokot s daily as needed  Alzheimer's disease Stable, weight and mood stable. decline anticipated, continue assistance with ADLS, falls precautions, pressure ulcer prophylaxis    Oneal Grout, MD Internal Medicine [facility] [address] [place], [place] [address] Cell Phone (Monday-Friday 8 am - 5 pm): [phone] On Call: [phone] and follow prompts after 5 pm and on weekends Office Phone: [phone] Office Fax: [phone]

## 2016-02-01 ENCOUNTER — Non-Acute Institutional Stay (SKILLED_NURSING_FACILITY): Payer: Medicare Other | Admitting: Adult Health

## 2016-02-01 ENCOUNTER — Encounter: Payer: Self-pay | Admitting: Adult Health

## 2016-02-01 DIAGNOSIS — IMO0002 Reserved for concepts with insufficient information to code with codable children: Secondary | ICD-10-CM

## 2016-02-01 DIAGNOSIS — M1710 Unilateral primary osteoarthritis, unspecified knee: Secondary | ICD-10-CM

## 2016-02-01 DIAGNOSIS — E559 Vitamin D deficiency, unspecified: Secondary | ICD-10-CM | POA: Diagnosis not present

## 2016-02-01 DIAGNOSIS — G309 Alzheimer's disease, unspecified: Secondary | ICD-10-CM | POA: Diagnosis not present

## 2016-02-01 DIAGNOSIS — M179 Osteoarthritis of knee, unspecified: Secondary | ICD-10-CM

## 2016-02-01 DIAGNOSIS — F0280 Dementia in other diseases classified elsewhere without behavioral disturbance: Secondary | ICD-10-CM

## 2016-02-01 DIAGNOSIS — I10 Essential (primary) hypertension: Secondary | ICD-10-CM | POA: Diagnosis not present

## 2016-02-01 DIAGNOSIS — E46 Unspecified protein-calorie malnutrition: Secondary | ICD-10-CM

## 2016-02-01 DIAGNOSIS — R531 Weakness: Secondary | ICD-10-CM

## 2016-02-01 NOTE — Progress Notes (Signed)
Patient ID: Barry Castillo, male   DOB: 05/20/1917, 82 y.o.   MRN: [id number]    DATE:  02/01/2016   MRN:  [id number]  BIRTHDAY: 12/24/1916  Facility:  Nursing Home Location:  [facility]  Nursing Home Room Number: 1003-A  LEVEL OF CARE:  SNF (31)  Contact Information    Name Relation Home Work Mobile   Sapp,Elizabeth Niece [phone]     Lloyd Hugerimberlake,Paul Nephew   [phone]       Code Status History    This patient does not have a recorded code status. Please follow your organizational policy for patients in this situation.       Chief Complaint  Patient presents with  . Medical Management of Chronic Issues    HISTORY OF PRESENT ILLNESS:  This is a 82 year old male who is being seen for a routine visit. He is a long[facility] care [facility].  He is currently having OT for  Lack of coordination due to muscle weakness.   PAST MEDICAL HISTORY:  Past Medical History:  Diagnosis Date  . Alzheimer's dementia without behavioral disturbance   . Anemia of chronic disease   . Benign essential HTN   . Chronic kidney disease   . Constipation   . Depression    Chronic  . Klebsiella cystitis   . Osteoarthrosis involving lower leg   . Protein calorie malnutrition (HCC)   . PVD (peripheral vascular disease) (HCC)   . Seborrhea capitis   . Vitamin D deficiency      CURRENT MEDICATIONS: Reviewed  Patient's Medications  New Prescriptions   No medications on file  Previous Medications   ACETAMINOPHEN (TYLENOL) 500 MG TABLET    Take 500 mg by mouth 2 (two) times daily as needed.    CHOLECALCIFEROL (VITAMIN D-3 PO)    Take 2,000 Units by mouth at bedtime.   LOPERAMIDE (IMODIUM) 2 MG CAPSULE    Take 4 mg by mouth once. With initial episode of diarrhea, then take one capsule = 2 mg with each episode of diarrhea, not to exceed 16 mg in a 24-hour period   POLYETHYL GLYCOL-PROPYL GLYCOL (SYSTANE) 0.4-0.3 % GEL    Apply 1 drop to eye 3 (three) times  daily.   PROTEIN (PROCEL) POWD    Take 1 scoop by mouth 2 (two) times daily.   SENNOSIDES-DOCUSATE SODIUM (SENOKOT-S) 8.6-50 MG TABLET    Take 1 tablet by mouth daily as needed for constipation.   UNABLE TO FIND    Med Name: MedPass 90 mL PO TID for nutritional support  Modified Medications   No medications on file  Discontinued Medications   LISINOPRIL (PRINIVIL,ZESTRIL) 2.5 MG TABLET    Take 2.5 mg by mouth daily. For HTN     No Known Allergies   REVIEW OF SYSTEMS:  Unable to obtain die to advanced dementia   PHYSICAL EXAMINATION  GENERAL APPEARANCE: Well nourished. In no acute distress. Normal body habitus SKIN:  Skin is warm and dry.  HEAD: Normal in size and contour. No evidence of trauma EYES: Lids open and close normally. No blepharitis, entropion or ectropion. PERRL.  EARS: Pinnae are normal. Patient hears normal voice tunes of the examiner MOUTH and THROAT: Lips are without lesions. Oral mucosa is moist and without lesions. Tongue is normal in shape, size, and color and without lesions NECK: supple, trachea midline, no neck masses, no thyroid tenderness, no thyromegaly LYMPHATICS: no LAN in the neck, no supraclavicular LAN RESPIRATORY: breathing is  even & unlabored, BS CTAB CARDIAC: RRR, no murmur,no extra heart sounds, no edema GI: abdomen soft, normal BS, no masses, no tenderness, no hepatomegaly, no splenomegaly EXTREMITIES:  Able to move X 4 extremities; BLE generalized weakness PSYCHIATRIC: Alert to self and disoriented to time and place. Affect and behavior are appropriate  LABS/RADIOLOGY: Labs reviewed: Basic Metabolic Panel:  Recent Labs  16/03/9601/08/17 09/23/15 11/24/15  NA 142 140 139  K 3.5 4.1 4.5  BUN 16 17 18   CREATININE 0.9 1.0 1.0   Liver Function Tests:  Recent Labs  04/13/15 07/28/15 11/24/15  AST 15 14 15   ALT 8* 7* 10  ALKPHOS 70 68 70   CBC:  Recent Labs  07/28/15 09/23/15 11/24/15  WBC 7.1 6.4 6.5  NEUTROABS  --  3  --   HGB 11.5*  11.6* 12.3*  HCT 35* 36* 38*  PLT 206 243 229    ASSESSMENT/PLAN:  Generalized weakness - recently started  OT ; fall precaution  Hypertension - well controlled; not on any medications  Protein calorie malnutrition - albumin 3.29;  continue Procel 1 scoop by mouth twice a day and Med pas 90 ml TID  Alzheimer's dementia - advanced; continue supportive care; fall precaution  Osteoarthrosis - continue Tylenol 500 mg by mouth twice a day PRN  Vitamin D deficiency - continue vitamin D3 2000 units 1 capsule by mouth daily at bedtime     Goals of care:  Long-term care     Kenard GowerMonina Medina-Vargas, NP [facility] [phone]

## 2016-02-28 ENCOUNTER — Non-Acute Institutional Stay (SKILLED_NURSING_FACILITY): Payer: Medicare Other | Admitting: Adult Health

## 2016-02-28 DIAGNOSIS — G309 Alzheimer's disease, unspecified: Secondary | ICD-10-CM | POA: Diagnosis not present

## 2016-02-28 DIAGNOSIS — M179 Osteoarthritis of knee, unspecified: Secondary | ICD-10-CM | POA: Diagnosis not present

## 2016-02-28 DIAGNOSIS — H04123 Dry eye syndrome of bilateral lacrimal glands: Secondary | ICD-10-CM | POA: Diagnosis not present

## 2016-02-28 DIAGNOSIS — K5900 Constipation, unspecified: Secondary | ICD-10-CM | POA: Diagnosis not present

## 2016-02-28 DIAGNOSIS — F0280 Dementia in other diseases classified elsewhere without behavioral disturbance: Secondary | ICD-10-CM

## 2016-02-28 DIAGNOSIS — M1710 Unilateral primary osteoarthritis, unspecified knee: Secondary | ICD-10-CM

## 2016-02-28 DIAGNOSIS — E46 Unspecified protein-calorie malnutrition: Secondary | ICD-10-CM | POA: Diagnosis not present

## 2016-02-28 DIAGNOSIS — E559 Vitamin D deficiency, unspecified: Secondary | ICD-10-CM

## 2016-02-28 DIAGNOSIS — IMO0002 Reserved for concepts with insufficient information to code with codable children: Secondary | ICD-10-CM

## 2016-02-28 DIAGNOSIS — I10 Essential (primary) hypertension: Secondary | ICD-10-CM | POA: Diagnosis not present

## 2016-02-29 ENCOUNTER — Encounter: Payer: Self-pay | Admitting: Adult Health

## 2016-02-29 NOTE — Progress Notes (Signed)
Patient ID: Barry Castillo, male   DOB: [birth year], 82 y.o.   MRN: [id number]    DATE:    02/28/16  MRN:  [id number]  BIRTHDAY: May 14, 1917  Facility:  Nursing Home Location:  [facility]  Nursing Home Room Number: 1003-A  LEVEL OF CARE:  SNF (31)  Contact Information    Name Relation Home Work Mobile   Sapp,Elizabeth Niece [phone]     Delmas, Faucett   [phone]       Code Status History    This patient does not have a recorded code status. Please follow your organizational policy for patients in this situation.       Chief Complaint  Patient presents with  . Medical Management of Chronic Issues    HISTORY OF PRESENT ILLNESS:  This is a 82 year old male who is being seen for a routine visit. He is a long-term care resident of [facility]  BPs 112/63, 128/69, 136/58, 143/63, 145/72. He is seen in his room today and did not complain of any pain.   PAST MEDICAL HISTORY:  Past Medical History:  Diagnosis Date  . Alzheimer's dementia without behavioral disturbance   . Anemia of chronic disease   . Benign essential HTN   . Chronic kidney disease   . Constipation   . Depression    Chronic  . Klebsiella cystitis   . Osteoarthrosis involving lower leg   . Protein calorie malnutrition (HCC)   . PVD (peripheral vascular disease) (HCC)   . Seborrhea capitis   . Vitamin D deficiency      CURRENT MEDICATIONS: Reviewed  Patient's Medications  New Prescriptions   No medications on file  Previous Medications   ACETAMINOPHEN (TYLENOL) 500 MG TABLET    Take 500 mg by mouth 2 (two) times daily as needed for mild pain.    CHOLECALCIFEROL (VITAMIN D-3 PO)    Take 2,000 Units by mouth at bedtime.   LOPERAMIDE (IMODIUM) 2 MG CAPSULE    Take 4 mg by mouth once. With initial episode of diarrhea, then take one capsule = 2 mg with each episode of diarrhea, not to exceed 16 mg in a 24-hour period   POLYETHYL GLYCOL-PROPYL GLYCOL (SYSTANE) 0.4-0.3 %  GEL    Apply 1 drop to eye 3 (three) times daily.   PROTEIN (PROCEL) POWD    Take 1 scoop by mouth 2 (two) times daily.   SENNOSIDES-DOCUSATE SODIUM (SENOKOT-S) 8.6-50 MG TABLET    Take 1 tablet by mouth daily as needed for constipation.   UNABLE TO FIND    Med Name: MedPass 90 mL PO TID for nutritional support  Modified Medications   No medications on file  Discontinued Medications   No medications on file     No Known Allergies   REVIEW OF SYSTEMS:  Unable to obtain due to advanced dementia   PHYSICAL EXAMINATION  GENERAL APPEARANCE: Well nourished. In no acute distress. Normal body habitus SKIN:  Skin is warm and dry.  HEAD: Normal in size and contour. No evidence of trauma EYES: Lids open and close normally. No blepharitis, entropion or ectropion. PERRL.  EARS: Pinnae are normal. Patient hears normal voice tunes of the examiner MOUTH and THROAT: Lips are without lesions. Oral mucosa is moist and without lesions. Tongue is normal in shape, size, and color and without lesions NECK: supple, trachea midline, no neck masses, no thyroid tenderness, no thyromegaly LYMPHATICS: no LAN in the neck, no supraclavicular LAN RESPIRATORY: breathing is even &  unlabored, BS CTAB CARDIAC: RRR, no murmur,no extra heart sounds, BLE 1+ edema GI: abdomen soft, normal BS, no masses, no tenderness, no hepatomegaly, no splenomegaly EXTREMITIES:  Able to move X 4 extremities; BLE generalized weakness PSYCHIATRIC: Alert to self and disoriented to time and place. Affect and behavior are appropriate  LABS/RADIOLOGY: Labs reviewed: Basic Metabolic Panel:  Recent Labs  09/81/1902/12/04 11/24/15   NA 140 139   K 4.1 4.5   BUN 17 18   CREATININE 1.0 1.0    Liver Function Tests:  Recent Labs  04/13/15 07/28/15 11/24/15  AST 15 14 15   ALT 8* 7* 10  ALKPHOS 70 68 70   CBC:  Recent Labs  09/23/15 11/24/15   WBC 6.4 6.5   NEUTROABS 3  --    HGB 11.6* 12.3*   HCT 36* 38*   PLT 243 229        ASSESSMENT/PLAN:  Dry eyes - continue Systane Ultra 0.4-0.3% instill 1 gtt into each eye TID  Hypertension - stable; not on any medications  Protein calorie malnutrition - continue Procel 1 scoop by mouth twice a day and Med pas 90 ml TID; check CBC and BMP  Alzheimer's dementia - advanced; continue supportive care; fall precaution  Osteoarthrosis - no complaints of pain; continue Tylenol 500 mg by mouth twice a day PRN  Vitamin D deficiency - continue vitamin D3 2000 units 1 capsule by mouth daily at bedtime  Constipation - continue Senna-S 8.6-50 mg 1 tab PO Q D PRN      Goals of care:  Long-term care     Kenard GowerMonina Medina-Vargas, NP [facility] [phone]

## 2016-03-01 LAB — CBC AND DIFFERENTIAL
HEMATOCRIT: 35 % — AB (ref 41–53)
HEMOGLOBIN: 11.8 g/dL — AB (ref 13.5–17.5)
Neutrophils Absolute: 3 /uL
Platelets: 186 10*3/uL (ref 150–399)
WBC: 6.7 10^3/mL

## 2016-03-01 LAB — BASIC METABOLIC PANEL
BUN: 19 mg/dL (ref 4–21)
Creatinine: 0.8 mg/dL (ref 0.6–1.3)
Glucose: 81 mg/dL
POTASSIUM: 3.8 mmol/L (ref 3.4–5.3)
SODIUM: 140 mmol/L (ref 137–147)

## 2016-03-15 DIAGNOSIS — H04123 Dry eye syndrome of bilateral lacrimal glands: Secondary | ICD-10-CM | POA: Insufficient documentation

## 2016-03-23 ENCOUNTER — Encounter: Payer: Self-pay | Admitting: Adult Health

## 2016-03-23 ENCOUNTER — Non-Acute Institutional Stay (SKILLED_NURSING_FACILITY): Payer: Medicare Other | Admitting: Adult Health

## 2016-03-23 DIAGNOSIS — G309 Alzheimer's disease, unspecified: Secondary | ICD-10-CM | POA: Diagnosis not present

## 2016-03-23 DIAGNOSIS — E559 Vitamin D deficiency, unspecified: Secondary | ICD-10-CM | POA: Diagnosis not present

## 2016-03-23 DIAGNOSIS — D638 Anemia in other chronic diseases classified elsewhere: Secondary | ICD-10-CM | POA: Diagnosis not present

## 2016-03-23 DIAGNOSIS — E46 Unspecified protein-calorie malnutrition: Secondary | ICD-10-CM | POA: Diagnosis not present

## 2016-03-23 DIAGNOSIS — H04123 Dry eye syndrome of bilateral lacrimal glands: Secondary | ICD-10-CM | POA: Diagnosis not present

## 2016-03-23 DIAGNOSIS — M159 Polyosteoarthritis, unspecified: Secondary | ICD-10-CM

## 2016-03-23 DIAGNOSIS — F0280 Dementia in other diseases classified elsewhere without behavioral disturbance: Secondary | ICD-10-CM

## 2016-03-23 DIAGNOSIS — K5900 Constipation, unspecified: Secondary | ICD-10-CM

## 2016-03-23 NOTE — Progress Notes (Signed)
Patient ID: Barry Castillo, male   DOB: 03/17/1917, 82 y.o.   MRN: [id number]    DATE:    03/23/16  MRN:  [id number]  BIRTHDAY: December 29, 1916  Facility:  Nursing Home Location:  [facility]  Nursing Home Room Number: 1003-A  LEVEL OF CARE:  SNF (31)  Contact Information    Name Relation Home Work Mobile   Sapp,Elizabeth Niece [phone]     Herley, Bernardini   [phone]       Code Status History    This patient does not have a recorded code status. Please follow your organizational policy for patients in this situation.       Chief Complaint  Patient presents with  . Medical Management of Chronic Issues    HISTORY OF PRESENT ILLNESS:  This is a 82 year old male who is being seen for a routine visit. He is a long-term care resident of [facility]. Latest hgb 11.8, stable. He has been stable for the past month.   PAST MEDICAL HISTORY:  Past Medical History:  Diagnosis Date  . Alzheimer's dementia without behavioral disturbance   . Anemia of chronic disease   . Benign essential HTN   . Chronic kidney disease   . Constipation   . Depression    Chronic  . Klebsiella cystitis   . Osteoarthrosis involving lower leg   . Protein calorie malnutrition (HCC)   . PVD (peripheral vascular disease) (HCC)   . Seborrhea capitis   . Vitamin D deficiency      CURRENT MEDICATIONS: Reviewed  Patient's Medications  New Prescriptions   No medications on file  Previous Medications   ACETAMINOPHEN (TYLENOL) 500 MG TABLET    Take 500 mg by mouth 2 (two) times daily as needed for mild pain.    CHOLECALCIFEROL (VITAMIN D-3 PO)    Take 2,000 Units by mouth at bedtime.   LOPERAMIDE (IMODIUM) 2 MG CAPSULE    Take 4 mg by mouth once. With initial episode of diarrhea, then take one capsule = 2 mg with each episode of diarrhea, not to exceed 16 mg in a 24-hour period   POLYETHYL GLYCOL-PROPYL GLYCOL (SYSTANE) 0.4-0.3 % GEL    Apply 1 drop to eye 3 (three) times  daily.   PROTEIN (PROCEL) POWD    Take 1 scoop by mouth 2 (two) times daily.   SENNOSIDES-DOCUSATE SODIUM (SENOKOT-S) 8.6-50 MG TABLET    Take 1 tablet by mouth daily as needed for constipation.   UNABLE TO FIND    Med Name: MedPass 90 mL PO TID for nutritional support  Modified Medications   No medications on file  Discontinued Medications   No medications on file     No Known Allergies   REVIEW OF SYSTEMS:  Unable to obtain due to advanced dementia   PHYSICAL EXAMINATION  GENERAL APPEARANCE: Well nourished. In no acute distress. Normal body habitus SKIN:  Skin is warm and dry.  HEAD: Normal in size and contour. No evidence of trauma EYES: Lids open and close normally. No blepharitis, entropion or ectropion. PERRL.  EARS: Pinnae are normal. Patient hears normal voice tunes of the examiner MOUTH and THROAT: Lips are without lesions. Oral mucosa is moist and without lesions. Tongue is normal in shape, size, and color and without lesions NECK: supple, trachea midline, no neck masses, no thyroid tenderness, no thyromegaly LYMPHATICS: no LAN in the neck, no supraclavicular LAN RESPIRATORY: breathing is even & unlabored, BS CTAB CARDIAC: RRR, no murmur,no extra heart  sounds, BLE 1+ edema GI: abdomen soft, normal BS, no masses, no tenderness, no hepatomegaly, no splenomegaly EXTREMITIES:  Able to move X 4 extremities; BLE generalized weakness PSYCHIATRIC: Alert to self and disoriented to time and place. Affect and behavior are appropriate  LABS/RADIOLOGY: Labs reviewed: 03/01/16   Wbc 6.7  hgb 11.8  hct 34.9  mcv 94.8  Platelet 186 Basic Metabolic Panel:  Na 140  K 3.8  Glucose 81  BUN 19  Creatinine 0.81  GFR>60  Recent Labs  09/23/15 11/24/15   NA 140 139   K 4.1 4.5   BUN 17 18   CREATININE 1.0 1.0    Liver Function Tests:  Recent Labs  04/13/15 07/28/15 11/24/15  AST 15 14 15   ALT 8* 7* 10  ALKPHOS 70 68 70   CBC:  Recent Labs  09/23/15 11/24/15   WBC 6.4 6.5    NEUTROABS 3  --    HGB 11.6* 12.3*   HCT 36* 38*   PLT 243 229       ASSESSMENT/PLAN:  Anemia of chronic disease - hgb 11.8; stable  Dry eyes - continue Systane Ultra 0.4-0.3% instill 1 gtt into each eye TID  Protein calorie malnutrition - continue Procel 1 scoop by mouth twice a day and Med pass 90 ml TID  Alzheimer's dementia - advanced; continue supportive care; fall precaution  Osteoarthrosis - no complaints of pain; continue Tylenol 500 mg by mouth twice a day PRN  Vitamin D deficiency - continue vitamin D3 2000 units 1 capsule by mouth daily at bedtime  Constipation - continue Senna-S 8.6-50 mg 1 tab PO Q D PRN     Goals of care:  Long-term care     Kenard GowerMonina Medina-Vargas, NP [facility] [phone]

## 2016-04-26 ENCOUNTER — Non-Acute Institutional Stay (SKILLED_NURSING_FACILITY): Payer: Medicare Other | Admitting: Adult Health

## 2016-04-26 ENCOUNTER — Encounter: Payer: Self-pay | Admitting: Adult Health

## 2016-04-26 DIAGNOSIS — R509 Fever, unspecified: Secondary | ICD-10-CM | POA: Diagnosis not present

## 2016-04-26 DIAGNOSIS — D638 Anemia in other chronic diseases classified elsewhere: Secondary | ICD-10-CM | POA: Diagnosis not present

## 2016-04-26 DIAGNOSIS — E46 Unspecified protein-calorie malnutrition: Secondary | ICD-10-CM

## 2016-04-26 DIAGNOSIS — G309 Alzheimer's disease, unspecified: Secondary | ICD-10-CM

## 2016-04-26 DIAGNOSIS — R310 Gross hematuria: Secondary | ICD-10-CM | POA: Diagnosis not present

## 2016-04-26 DIAGNOSIS — H04123 Dry eye syndrome of bilateral lacrimal glands: Secondary | ICD-10-CM

## 2016-04-26 DIAGNOSIS — F0280 Dementia in other diseases classified elsewhere without behavioral disturbance: Secondary | ICD-10-CM

## 2016-04-26 DIAGNOSIS — E559 Vitamin D deficiency, unspecified: Secondary | ICD-10-CM

## 2016-04-26 NOTE — Progress Notes (Signed)
Patient ID: Barry Castillo, male   DOB: 12/02/1916, 82 y.o.   MRN: [id number]    DATE:    04/26/16  MRN:  [id number]  BIRTHDAY: 05/20/1917  Facility:  Nursing Home Location:  [facility]  Nursing Home Room Number: 1003-A  LEVEL OF CARE:  SNF (31)  Contact Information    Name Relation Home Work Mobile   Sapp,Elizabeth Niece [phone]     Lloyd Hugerimberlake,Paul Nephew   [phone]       Code Status History    This patient does not have a recorded code status. Please follow your organizational policy for patients in this situation.       Chief Complaint  Patient presents with  . Medical Management of Chronic Issues    HISTORY OF PRESENT ILLNESS:  This is a 82 year old male who is being seen for a routine visit. He is a [facility]. He was noted to be shivering and having fever. He was noted to have loose stools. He has been reported to have drunk the regular that day. He has milk intolerance. He was reported to have hematuria.  PAST MEDICAL HISTORY:  Past Medical History:  Diagnosis Date  . Alzheimer's dementia without behavioral disturbance   . Anemia of chronic disease   . Benign essential HTN   . Chronic kidney disease   . Constipation   . Depression    Chronic  . Klebsiella cystitis   . Osteoarthrosis involving lower leg   . Protein calorie malnutrition (HCC)   . PVD (peripheral vascular disease) (HCC)   . Seborrhea capitis   . Vitamin D deficiency      CURRENT MEDICATIONS: Reviewed  Patient's Medications  New Prescriptions   No medications on file  Previous Medications   ACETAMINOPHEN (TYLENOL) 325 MG TABLET    Take 650 mg by mouth every 4 (four) hours as needed for fever.   ACETAMINOPHEN (TYLENOL) 500 MG TABLET    Take 500 mg by mouth 2 (two) times daily as needed for mild pain.    CHOLECALCIFEROL (VITAMIN D-3 PO)    Take 2,000 Units by mouth at bedtime.   LOPERAMIDE (IMODIUM) 2 MG CAPSULE    Take 4 mg by mouth  once. With initial episode of diarrhea, then take one capsule = 2 mg with each episode of diarrhea, not to exceed 16 mg in a 24-hour period   POLYETHYL GLYCOL-PROPYL GLYCOL (SYSTANE) 0.4-0.3 % GEL    Apply 1 drop to eye 3 (three) times daily.   PROMETHAZINE (PHENERGAN) 25 MG/ML INJECTION    Inject 25 mg into the vein every 6 (six) hours as needed for nausea or vomiting.   PROTEIN (PROCEL) POWD    Take 1 scoop by mouth 2 (two) times daily.   SACCHAROMYCES BOULARDII (FLORASTOR) 250 MG CAPSULE    Take 250 mg by mouth 2 (two) times daily. x10 days   SENNOSIDES-DOCUSATE SODIUM (SENOKOT-S) 8.6-50 MG TABLET    Take 1 tablet by mouth daily as needed for constipation.   SULFAMETHOXAZOLE-TRIMETHOPRIM (BACTRIM DS,SEPTRA DS) 800-160 MG TABLET    Take 1 tablet by mouth 2 (two) times daily. x7 days   UNABLE TO FIND    Med Name: MedPass 90 mL PO TID for nutritional support  Modified Medications   No medications on file  Discontinued Medications   No medications on file     No Known Allergies   REVIEW OF SYSTEMS:  Unable to obtain due to advanced dementia  PHYSICAL EXAMINATION  GENERAL APPEARANCE: Well nourished. In no acute distress. Normal body habitus SKIN:  Skin is warm and dry, shivering HEAD: Normal in size and contour. No evidence of trauma EYES: Lids open and close normally. No blepharitis, entropion or ectropion. PERRL.  EARS: Pinnae are normal. Patient hears normal voice tunes of the examiner MOUTH and THROAT: Lips are without lesions. Oral mucosa is moist and without lesions. Tongue is normal in shape, size, and color and without lesions NECK: supple, trachea midline, no neck masses, no thyroid tenderness, no thyromegaly LYMPHATICS: no LAN in the neck, no supraclavicular LAN RESPIRATORY: breathing is even & unlabored, BS CTAB CARDIAC: RRR, no murmur,no extra heart sounds, BLE 1+ edema GI: abdomen soft, normal BS, no masses, no tenderness, no hepatomegaly, no splenomegaly EXTREMITIES:   Able to move X 4 extremities; BLE generalized weakness PSYCHIATRIC: Alert to self and disoriented to time and place. Affect and behavior are appropriate  LABS/RADIOLOGY: Labs reviewed: 03/01/16   Wbc 6.7  hgb 11.8  hct 34.9  mcv 94.8  Platelet 186 Basic Metabolic Panel:  Na 140  K 3.8  Glucose 81  BUN 19  Creatinine 0.81  GFR>60  Recent Labs  09/23/15 11/24/15   NA 140 139   K 4.1 4.5   BUN 17 18   CREATININE 1.0 1.0    Liver Function Tests:  Recent Labs  07/28/15 11/24/15  AST 14 15  ALT 7* 10  ALKPHOS 68 70   CBC:  Recent Labs  09/23/15 11/24/15   WBC 6.4 6.5   NEUTROABS 3  --    HGB 11.6* 12.3*   HCT 36* 38*   PLT 243 229       ASSESSMENT/PLAN:  Fever - check for UA with CS and do chest X-ray, CBC and BMP stat; give Acetaminophen 325 mg mg 2  Tabs = 650 mg PO Q 4 hours PRN for fever  Hematuria - check for UA w/ CS; possibly has UTI, start Bactrim DS 1 tab PO BID X 7 days and Florastor 250 mg 1 capsule PO BID X 10 days  Anemia of chronic disease - stable Lab Results  Component Value Date   HGB 11.8 (A) 03/01/2016    Dry eyes - continue Systane Ultra 0.4-0.3% instill 1 gtt into each eye TID  Protein calorie malnutrition - continue Procel 1 scoop by mouth twice a day and Med pass 90 ml TID  Alzheimer's dementia - advanced; continue supportive care; fall precaution  Vitamin D deficiency - continue vitamin D3 2000 units by mouth daily at bedtime     Goals of care:  Long-term care     Kenard GowerMonina Medina-Vargas, NP [facility] [phone]

## 2016-04-27 LAB — BASIC METABOLIC PANEL
BUN: 22 mg/dL — AB (ref 4–21)
Creatinine: 1.3 mg/dL (ref 0.6–1.3)
GLUCOSE: 131 mg/dL
Potassium: 3.3 mmol/L — AB (ref 3.4–5.3)
Sodium: 141 mmol/L (ref 137–147)

## 2016-04-27 LAB — CBC AND DIFFERENTIAL
HEMATOCRIT: 40 % — AB (ref 41–53)
HEMOGLOBIN: 13 g/dL — AB (ref 13.5–17.5)
Platelets: 181 10*3/uL (ref 150–399)
WBC: 4.2 10*3/mL

## 2016-05-08 LAB — BASIC METABOLIC PANEL
BUN: 17 mg/dL (ref 4–21)
CREATININE: 0.9 mg/dL (ref 0.6–1.3)
Glucose: 92 mg/dL
POTASSIUM: 3.8 mmol/L (ref 3.4–5.3)
Sodium: 138 mmol/L (ref 137–147)

## 2016-05-14 ENCOUNTER — Encounter (HOSPITAL_COMMUNITY): Payer: Self-pay

## 2016-05-14 ENCOUNTER — Emergency Department (HOSPITAL_COMMUNITY): Payer: Medicare Other

## 2016-05-14 ENCOUNTER — Inpatient Hospital Stay (HOSPITAL_COMMUNITY)
Admission: EM | Admit: 2016-05-14 | Discharge: 2016-05-18 | DRG: 871 | Disposition: A | Discharge status: Skilled Nursing Facility | Payer: Medicare Other | Attending: Internal Medicine | Admitting: Internal Medicine

## 2016-05-14 DIAGNOSIS — N179 Acute kidney failure, unspecified: Secondary | ICD-10-CM | POA: Diagnosis present

## 2016-05-14 DIAGNOSIS — A419 Sepsis, unspecified organism: Secondary | ICD-10-CM | POA: Diagnosis present

## 2016-05-14 DIAGNOSIS — F329 Major depressive disorder, single episode, unspecified: Secondary | ICD-10-CM | POA: Diagnosis present

## 2016-05-14 DIAGNOSIS — Y95 Nosocomial condition: Secondary | ICD-10-CM | POA: Diagnosis present

## 2016-05-14 DIAGNOSIS — Z79899 Other long term (current) drug therapy: Secondary | ICD-10-CM

## 2016-05-14 DIAGNOSIS — B961 Klebsiella pneumoniae [K. pneumoniae] as the cause of diseases classified elsewhere: Secondary | ICD-10-CM | POA: Diagnosis present

## 2016-05-14 DIAGNOSIS — Z87891 Personal history of nicotine dependence: Secondary | ICD-10-CM

## 2016-05-14 DIAGNOSIS — G309 Alzheimer's disease, unspecified: Secondary | ICD-10-CM

## 2016-05-14 DIAGNOSIS — R4702 Dysphasia: Secondary | ICD-10-CM | POA: Diagnosis present

## 2016-05-14 DIAGNOSIS — R6520 Severe sepsis without septic shock: Secondary | ICD-10-CM | POA: Diagnosis present

## 2016-05-14 DIAGNOSIS — J189 Pneumonia, unspecified organism: Secondary | ICD-10-CM | POA: Diagnosis present

## 2016-05-14 DIAGNOSIS — R7989 Other specified abnormal findings of blood chemistry: Secondary | ICD-10-CM | POA: Diagnosis present

## 2016-05-14 DIAGNOSIS — E559 Vitamin D deficiency, unspecified: Secondary | ICD-10-CM | POA: Diagnosis present

## 2016-05-14 DIAGNOSIS — I739 Peripheral vascular disease, unspecified: Secondary | ICD-10-CM | POA: Diagnosis present

## 2016-05-14 DIAGNOSIS — R1310 Dysphagia, unspecified: Secondary | ICD-10-CM | POA: Diagnosis present

## 2016-05-14 DIAGNOSIS — E876 Hypokalemia: Secondary | ICD-10-CM | POA: Diagnosis present

## 2016-05-14 DIAGNOSIS — A401 Sepsis due to streptococcus, group B: Secondary | ICD-10-CM | POA: Diagnosis present

## 2016-05-14 DIAGNOSIS — Z993 Dependence on wheelchair: Secondary | ICD-10-CM

## 2016-05-14 DIAGNOSIS — G301 Alzheimer's disease with late onset: Secondary | ICD-10-CM | POA: Diagnosis not present

## 2016-05-14 DIAGNOSIS — I129 Hypertensive chronic kidney disease with stage 1 through stage 4 chronic kidney disease, or unspecified chronic kidney disease: Secondary | ICD-10-CM | POA: Diagnosis present

## 2016-05-14 DIAGNOSIS — Z638 Other specified problems related to primary support group: Secondary | ICD-10-CM

## 2016-05-14 DIAGNOSIS — R509 Fever, unspecified: Secondary | ICD-10-CM | POA: Diagnosis present

## 2016-05-14 DIAGNOSIS — R945 Abnormal results of liver function studies: Secondary | ICD-10-CM

## 2016-05-14 DIAGNOSIS — N390 Urinary tract infection, site not specified: Secondary | ICD-10-CM | POA: Diagnosis present

## 2016-05-14 DIAGNOSIS — D638 Anemia in other chronic diseases classified elsewhere: Secondary | ICD-10-CM | POA: Diagnosis present

## 2016-05-14 DIAGNOSIS — E872 Acidosis, unspecified: Secondary | ICD-10-CM | POA: Diagnosis present

## 2016-05-14 DIAGNOSIS — N189 Chronic kidney disease, unspecified: Secondary | ICD-10-CM | POA: Diagnosis present

## 2016-05-14 DIAGNOSIS — H9190 Unspecified hearing loss, unspecified ear: Secondary | ICD-10-CM | POA: Diagnosis present

## 2016-05-14 DIAGNOSIS — F0280 Dementia in other diseases classified elsewhere without behavioral disturbance: Secondary | ICD-10-CM | POA: Diagnosis present

## 2016-05-14 DIAGNOSIS — J181 Lobar pneumonia, unspecified organism: Secondary | ICD-10-CM | POA: Diagnosis not present

## 2016-05-14 DIAGNOSIS — Z66 Do not resuscitate: Secondary | ICD-10-CM | POA: Diagnosis present

## 2016-05-14 LAB — URINALYSIS, ROUTINE W REFLEX MICROSCOPIC
BILIRUBIN URINE: NEGATIVE
Glucose, UA: NEGATIVE mg/dL
Ketones, ur: NEGATIVE mg/dL
Nitrite: POSITIVE — AB
Protein, ur: NEGATIVE mg/dL
SPECIFIC GRAVITY, URINE: 1.01 (ref 1.005–1.030)
pH: 5 (ref 5.0–8.0)

## 2016-05-14 LAB — URINE MICROSCOPIC-ADD ON

## 2016-05-14 LAB — COMPREHENSIVE METABOLIC PANEL
ALK PHOS: 97 U/L (ref 38–126)
ALT: 57 U/L (ref 17–63)
AST: 80 U/L — ABNORMAL HIGH (ref 15–41)
Albumin: 3.1 g/dL — ABNORMAL LOW (ref 3.5–5.0)
Anion gap: 13 (ref 5–15)
BILIRUBIN TOTAL: 0.8 mg/dL (ref 0.3–1.2)
BUN: 21 mg/dL — ABNORMAL HIGH (ref 6–20)
CALCIUM: 8.7 mg/dL — AB (ref 8.9–10.3)
CO2: 16 mmol/L — ABNORMAL LOW (ref 22–32)
CREATININE: 1.51 mg/dL — AB (ref 0.61–1.24)
Chloride: 108 mmol/L (ref 101–111)
GFR, EST AFRICAN AMERICAN: 43 mL/min — AB (ref >60)
GFR, EST NON AFRICAN AMERICAN: 37 mL/min — AB (ref >60)
Glucose, Bld: 122 mg/dL — ABNORMAL HIGH (ref 65–99)
Potassium: 3.3 mmol/L — ABNORMAL LOW (ref 3.5–5.1)
Sodium: 137 mmol/L (ref 135–145)
TOTAL PROTEIN: 6.5 g/dL (ref 6.5–8.1)

## 2016-05-14 LAB — CBC WITH DIFFERENTIAL/PLATELET
BASOS ABS: 0 10*3/uL (ref 0.0–0.1)
BASOS PCT: 0 %
EOS ABS: 0 10*3/uL (ref 0.0–0.7)
EOS PCT: 0 %
HCT: 35.5 % — ABNORMAL LOW (ref 39.0–52.0)
Hemoglobin: 12.1 g/dL — ABNORMAL LOW (ref 13.0–17.0)
LYMPHS PCT: 3 %
Lymphs Abs: 0.2 10*3/uL — ABNORMAL LOW (ref 0.7–4.0)
MCH: 31.2 pg (ref 26.0–34.0)
MCHC: 34.1 g/dL (ref 30.0–36.0)
MCV: 91.5 fL (ref 78.0–100.0)
MONO ABS: 0.1 10*3/uL (ref 0.1–1.0)
Monocytes Relative: 1 %
Neutro Abs: 6.3 10*3/uL (ref 1.7–7.7)
Neutrophils Relative %: 96 %
PLATELETS: 212 10*3/uL (ref 150–400)
RBC: 3.88 MIL/uL — ABNORMAL LOW (ref 4.22–5.81)
RDW: 13.9 % (ref 11.5–15.5)
WBC: 6.6 10*3/uL (ref 4.0–10.5)

## 2016-05-14 LAB — LACTIC ACID, PLASMA: Lactic Acid, Venous: 3.9 mmol/L (ref 0.5–1.9)

## 2016-05-14 LAB — I-STAT CG4 LACTIC ACID, ED: LACTIC ACID, VENOUS: 4.57 mmol/L — AB (ref 0.5–1.9)

## 2016-05-14 LAB — PROTIME-INR
INR: 1.18
Prothrombin Time: 15 seconds (ref 11.4–15.2)

## 2016-05-14 LAB — CBG MONITORING, ED: GLUCOSE-CAPILLARY: 116 mg/dL — AB (ref 65–99)

## 2016-05-14 MED ORDER — SENNOSIDES-DOCUSATE SODIUM 8.6-50 MG PO TABS
1.0000 | ORAL_TABLET | Freq: Every evening | ORAL | Status: DC | PRN
  Filled 2016-05-14: qty 1

## 2016-05-14 MED ORDER — VANCOMYCIN HCL IN DEXTROSE 1-5 GM/200ML-% IV SOLN
1000.0000 mg | Freq: Once | INTRAVENOUS | Status: AC
  Administered 2016-05-14: 1000 mg via INTRAVENOUS
  Filled 2016-05-14: qty 200

## 2016-05-14 MED ORDER — AZITHROMYCIN 250 MG PO TABS
500.0000 mg | ORAL_TABLET | Freq: Every day | ORAL | Status: DC
  Administered 2016-05-14: 500 mg via ORAL
  Filled 2016-05-14: qty 2

## 2016-05-14 MED ORDER — POTASSIUM CHLORIDE IN NACL 20-0.9 MEQ/L-% IV SOLN
INTRAVENOUS | Status: DC
  Administered 2016-05-14 – 2016-05-16 (×5): via INTRAVENOUS
  Filled 2016-05-14 (×5): qty 1000

## 2016-05-14 MED ORDER — ENSURE ENLIVE PO LIQD
237.0000 mL | Freq: Two times a day (BID) | ORAL | Status: DC
  Administered 2016-05-15 – 2016-05-18 (×7): 237 mL via ORAL

## 2016-05-14 MED ORDER — DEXTROSE 5 % IV SOLN
1.0000 g | Freq: Every day | INTRAVENOUS | Status: DC
  Administered 2016-05-14: 1 g via INTRAVENOUS
  Filled 2016-05-14 (×2): qty 10

## 2016-05-14 MED ORDER — LIDOCAINE HCL 2 % EX GEL
1.0000 "application " | Freq: Once | CUTANEOUS | Status: AC
  Administered 2016-05-14: 1 via URETHRAL
  Filled 2016-05-14: qty 11

## 2016-05-14 MED ORDER — ONDANSETRON HCL 4 MG PO TABS: 4.0000 mg | ORAL_TABLET | Freq: Four times a day (QID) | ORAL | Status: DC | PRN

## 2016-05-14 MED ORDER — ACETAMINOPHEN 650 MG RE SUPP: 650.0000 mg | Freq: Four times a day (QID) | RECTAL | Status: DC | PRN

## 2016-05-14 MED ORDER — ENOXAPARIN SODIUM 40 MG/0.4ML ~~LOC~~ SOLN
40.0000 mg | Freq: Every day | SUBCUTANEOUS | Status: DC
  Administered 2016-05-14 – 2016-05-17 (×4): 40 mg via SUBCUTANEOUS
  Filled 2016-05-14 (×4): qty 0.4

## 2016-05-14 MED ORDER — ACETAMINOPHEN 325 MG PO TABS
650.0000 mg | ORAL_TABLET | Freq: Four times a day (QID) | ORAL | Status: DC | PRN
  Filled 2016-05-14: qty 2

## 2016-05-14 MED ORDER — SODIUM CHLORIDE 0.9 % IV BOLUS (SEPSIS)
30.0000 mL/kg | Freq: Once | INTRAVENOUS | Status: AC
  Administered 2016-05-14: 2589 mL via INTRAVENOUS

## 2016-05-14 MED ORDER — ONDANSETRON HCL 4 MG/2ML IJ SOLN: 4.0000 mg | Freq: Four times a day (QID) | INTRAMUSCULAR | Status: DC | PRN

## 2016-05-14 MED ORDER — PIPERACILLIN-TAZOBACTAM 3.375 G IVPB
3.3750 g | Freq: Once | INTRAVENOUS | Status: AC
  Administered 2016-05-14: 3.375 g via INTRAVENOUS
  Filled 2016-05-14: qty 50

## 2016-05-14 NOTE — ED Provider Notes (Signed)
[facility]-EMERGENCY DEPT Provider Note   CSN: [id number] Arrival date & time: 05/14/16  1832     History   Chief Complaint Chief Complaint  Patient presents with  . Fever    HPI Jonty Chanetta Marshallimberlake is a 82 y.o. male.  The history is provided by the EMS personnel and a relative. No language interpreter was used.  Fever      Kaesen Chanetta Marshallimberlake is a 82 y.o. male who presents to the Emergency Department complaining of fever.  Level V caveat due to dementia.  History is provided by EMS and patient's niece and nephew.  Per report he had a fever today it was sent to the ER. His niece states that he had a fever earlier last week and was treated with antibiotics. She states that he does not talk when he is sick but will drink water. When he is feeling well he will interact and carry on conversations.  Past Medical History:  Diagnosis Date  . Alzheimer's dementia without behavioral disturbance   . Anemia of chronic disease   . Benign essential HTN   . Chronic kidney disease   . Constipation   . Depression    Chronic  . Klebsiella cystitis   . Osteoarthrosis involving lower leg   . Protein calorie malnutrition (HCC)   . PVD (peripheral vascular disease) (HCC)   . Seborrhea capitis   . Vitamin D deficiency     Patient Active Problem List   Diagnosis Date Noted  . Sepsis (HCC) 05/14/2016  . Hypokalemia 05/14/2016  . Acidosis 05/14/2016  . Elevated LFTs 05/14/2016  . CAP (community acquired pneumonia) 05/14/2016  . AKI (acute kidney injury) (HCC) 05/14/2016  . Dry eyes 03/15/2016  . Seborrhea capitis 07/26/2015  . Alzheimer's disease 07/26/2015  . Dry eyes due to decreased tear production 07/26/2015  . Chronic depression 07/26/2015  . Protein-calorie malnutrition (HCC) 04/26/2015  . Essential hypertension 10/14/2014  . Alzheimer's type dementia with late onset without behavioral disturbance 09/17/2014  . CN (constipation) 09/17/2014  . Vitamin D deficiency 09/17/2014  . Hypertensive  heart and renal disease 09/17/2014  . Major depressive disorder, recurrent episode, mild (HCC) 09/17/2014  . Wheelchair dependence 09/17/2014  . Osteoarthrosis, unspecified whether generalized or localized, involving lower leg 09/17/2014    History reviewed. No pertinent surgical history.     Home Medications    Prior to Admission medications   Medication Sig Start Date End Date Taking? Authorizing Provider  acetaminophen (TYLENOL) 325 MG tablet Take 650 mg by mouth every 4 (four) hours as needed for fever.   Yes Historical Provider, MD  acetaminophen (TYLENOL) 500 MG tablet Take 500 mg by mouth 2 (two) times daily as needed for mild pain or moderate pain.    Yes Historical Provider, MD  cholecalciferol (VITAMIN D) 1000 units tablet Take 2,000 Units by mouth at bedtime.   Yes Historical Provider, MD  loperamide (IMODIUM) 2 MG capsule Take 2-4 mg by mouth every 6 (six) hours as needed for diarrhea or loose stools.    Yes Historical Provider, MD  magic mouthwash SOLN Take 10 mLs by mouth 4 (four) times daily. Pt is to swab in the mouth and on the lips four times daily. 05/02/16 05/16/16 Yes Historical Provider, MD  Nutritional Supplements (NUTRITIONAL SUPPLEMENT PO) Take 90 mLs by mouth 3 (three) times daily. Medpass   Yes Historical Provider, MD  Polyethyl Glycol-Propyl Glycol (SYSTANE ULTRA) 0.4-0.3 % SOLN Place 1 drop into both eyes 3 (three) times daily.  Yes Historical Provider, MD  promethazine (PHENERGAN) 25 MG/ML injection Inject 25 mg into the muscle every 6 (six) hours as needed for nausea or vomiting.    Yes Historical Provider, MD  Protein (PROCEL) POWD Take 1 scoop by mouth 2 (two) times daily.   Yes Historical Provider, MD  sennosides-docusate sodium (SENOKOT-S) 8.6-50 MG tablet Take 1 tablet by mouth daily as needed for constipation.   Yes Historical Provider, MD    Family History History reviewed. No pertinent family history.  Social History Social History  Substance  Use Topics  . Smoking status: Former Smoker    Types: Pipe  . Smokeless tobacco: Never Used  . Alcohol use No     Allergies   Patient has no known allergies.   Review of Systems Review of Systems  Unable to perform ROS: Dementia  Constitutional: Positive for fever.     Physical Exam Updated Vital Signs BP 106/74 (BP Location: Left Arm)   Pulse 91   Temp 99.5 F (37.5 C) (Axillary)   Resp 18   Ht 6\' 3"  (1.905 m)   Wt 189 lb 2.5 oz (85.8 kg)   SpO2 100%   BMI 23.64 kg/m   Physical Exam  Constitutional: He appears well-developed.  Ill appearing  HENT:  Head: Normocephalic and atraumatic.  Cardiovascular: Regular rhythm.   No murmur heard. Tachycardic  Pulmonary/Chest: Effort normal. No respiratory distress.  Decreased air movement bilateral bases  Abdominal: Soft. There is no rebound and no guarding.  Mild diffuse abdominal tenderness  Genitourinary:  Genitourinary Comments: blood from penis.  Musculoskeletal: He exhibits no tenderness.  2+ pitting edema bilateral lower extremities  Neurological: He is alert.  Nonverbal, does not follow commands. Mumbles and response to painful stimuli.  Skin: Skin is warm and dry.  Psychiatric:  Unable to assess  Nursing note and vitals reviewed.    ED Treatments / Results  Labs (all labs ordered are listed, but only abnormal results are displayed) Labs Reviewed  COMPREHENSIVE METABOLIC PANEL - Abnormal; Notable for the following:       Result Value   Potassium 3.3 (*)    CO2 16 (*)    Glucose, Bld 122 (*)    BUN 21 (*)    Creatinine, Ser 1.51 (*)    Calcium 8.7 (*)    Albumin 3.1 (*)    AST 80 (*)    GFR calc non Af Amer 37 (*)    GFR calc Af Amer 43 (*)    All other components within normal limits  CBC WITH DIFFERENTIAL/PLATELET - Abnormal; Notable for the following:    RBC 3.88 (*)    Hemoglobin 12.1 (*)    HCT 35.5 (*)    Lymphs Abs 0.2 (*)    All other components within normal limits  URINALYSIS,  ROUTINE W REFLEX MICROSCOPIC (NOT AT [facility]) - Abnormal; Notable for the following:    APPearance TURBID (*)    Hgb urine dipstick LARGE (*)    Nitrite POSITIVE (*)    Leukocytes, UA LARGE (*)    All other components within normal limits  LACTIC ACID, PLASMA - Abnormal; Notable for the following:    Lactic Acid, Venous 3.9 (*)    All other components within normal limits  URINE MICROSCOPIC-ADD ON - Abnormal; Notable for the following:    Squamous Epithelial / LPF 0-5 (*)    Bacteria, UA MANY (*)    All other components within normal limits  I-STAT CG4 LACTIC ACID, ED -  Abnormal; Notable for the following:    Lactic Acid, Venous 4.57 (*)    All other components within normal limits  CBG MONITORING, ED - Abnormal; Notable for the following:    Glucose-Capillary 116 (*)    All other components within normal limits  CULTURE, BLOOD (ROUTINE X 2)  CULTURE, BLOOD (ROUTINE X 2)  URINE CULTURE  MRSA PCR SCREENING  PROTIME-INR  LACTIC ACID, PLASMA  COMPREHENSIVE METABOLIC PANEL  CBC    EKG  EKG Interpretation None       Radiology Dg Chest 2 View  Result Date: 05/14/2016 CLINICAL DATA:  Fever, sepsis. EXAM: CHEST  2 VIEW COMPARISON:  None. FINDINGS: The heart size and mediastinal contours are within normal limits. Atherosclerosis of thoracic aorta is noted. No pneumothorax or pleural effusion is noted. Left lung is clear. Mild right basilar opacity is noted concerning for subsegmental atelectasis or infiltrate. The visualized skeletal structures are unremarkable. IMPRESSION: Mild right basilar subsegmental atelectasis or pneumonia. Follow-up radiographs are recommended. Aortic atherosclerosis. Electronically Signed   By: Lupita RaiderJames  Green Jr, M.D.   On: 05/14/2016 20:24    Procedures Procedures (including critical care time)  Medications Ordered in ED Medications  feeding supplement (ENSURE ENLIVE) (ENSURE ENLIVE) liquid 237 mL (not administered)  cefTRIAXone (ROCEPHIN) 1 g in dextrose  5 % 50 mL IVPB (1 g Intravenous Given 05/14/16 2344)  azithromycin (ZITHROMAX) tablet 500 mg (500 mg Oral Given 05/14/16 2344)  enoxaparin (LOVENOX) injection 40 mg (40 mg Subcutaneous Given 05/14/16 2344)  0.9 % NaCl with KCl 20 mEq/ L  infusion ( Intravenous Rate/Dose Verify 05/15/16 0000)  acetaminophen (TYLENOL) tablet 650 mg (not administered)    Or  acetaminophen (TYLENOL) suppository 650 mg (not administered)  senna-docusate (Senokot-S) tablet 1 tablet (not administered)  ondansetron (ZOFRAN) tablet 4 mg (not administered)    Or  ondansetron (ZOFRAN) injection 4 mg (not administered)  sodium chloride 0.9 % bolus 2,589 mL (0 mL/kg  86.3 kg Intravenous Stopped 05/14/16 2103)  piperacillin-tazobactam (ZOSYN) IVPB 3.375 g (0 g Intravenous Stopped 05/14/16 2115)  vancomycin (VANCOCIN) IVPB 1000 mg/200 mL premix (0 mg Intravenous Stopped 05/14/16 2102)  lidocaine (XYLOCAINE) 2 % jelly 1 application (1 application Urethral Given 05/14/16 2202)     Initial Impression / Assessment and Plan / ED Course  I have reviewed the triage vital signs and the nursing notes.  Pertinent labs & imaging results that were available during my care of the patient were reviewed by me and considered in my medical decision making (see chart for details).  Clinical Course     Patient from nursing home for fever and change in mental status. Patient was ill appearing on initial evaluation he was minimally responsive. After IV fluid hydration and antibiotics he is more alert and interactive. On repeat assessment he denies any pain. Chest x-ray concerning for possible developing pneumonia, UA is consistent with UTI. He was started on broad-spectrum antibiotics on presentation for presumed sepsis. Hospitals contacted for admission for further treatment.  Final Clinical Impressions(s) / ED Diagnoses   Final diagnoses:  None    New Prescriptions Current Discharge Medication List       Tilden FossaElizabeth Ellana Kawa,  MD 05/15/16 0107

## 2016-05-14 NOTE — ED Notes (Signed)
Zosyn delayed in order to verify order rate of administration with EDP.

## 2016-05-14 NOTE — H&P (Signed)
History and Physical  Patient Name: Barry Castillo     ZOX:[id number]    DOB: 12/08/1916    DOA: 05/14/2016 PCP: Oneal GroutPANDEY, MAHIMA, MD   Patient coming from: [facility]  Chief Complaint: Fever  HPI: Barry Castillo is a 82 y.o. male with a past medical history significant for advanced dementia who presents with fever for 1 day.  Caveat that all history is collected from EMS via EDP and from family as patient has advanced dementia.  Evidently, the patient had an episode of gross hematuria today, that was self-limited. He subsequently was found to have fever, to 103F as well as decreased responsiveness, and so arrangements were made to transport him to the hospital.  ED course: -Febrile to 102.45F, heart rate 96, respirations 23, pulse oximetry normal, blood pressure 124/65 -Na 137, K 3.3, Cr 1.51 (baseline 0.8), WBC 6.6K, Hgb 12.1 -HCO3 16, anion gap normal, lactate 4.5 -CXR showed a new right base opacity, urine could not be obtained -He was administered 30 cc/kg fluids, blood cultures were obtained and vancomycin and Zosyn were given for unspecified sepsis and TRH were asked to evaluate   At baseline, the patient's nieces at the bedside, and his nursing home notes suggest that he is oriented to self and responsive to questions (although hard of hearing), but not oriented to place or time.  He was divorced, is estranged from his children, nieces are HCPOAs, documentation available in Media tab.     ROS: Review of Systems  Constitutional: Positive for fever.  Genitourinary: Positive for hematuria.  Neurological: Positive for weakness (and decreased responsiveness).  All other systems reviewed and are negative.         Past Medical History:  Diagnosis Date  . Alzheimer's dementia without behavioral disturbance   . Anemia of chronic disease   . Benign essential HTN   . Chronic kidney disease   . Constipation   . Depression    Chronic  . Klebsiella cystitis   .  Osteoarthrosis involving lower leg   . Protein calorie malnutrition (HCC)   . PVD (peripheral vascular disease) (HCC)   . Seborrhea capitis   . Vitamin D deficiency     History reviewed. No pertinent surgical history.  Social History: [facility] lives [facility].  The patient is wheelchair bound at baseline.  He is a former pipe smoker.  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    No Known Allergies  Family history: Unable to obtain due to dementia  Prior to Admission medications   Medication Sig Start Date End Date Taking? Authorizing Provider  acetaminophen (TYLENOL) 325 MG tablet Take 650 mg by mouth every 4 (four) hours as needed for fever.   Yes Historical Provider, MD  acetaminophen (TYLENOL) 500 MG tablet Take 500 mg by mouth 2 (two) times daily as needed for mild pain or moderate pain.    Yes Historical Provider, MD  cholecalciferol (VITAMIN D) 1000 units tablet Take 2,000 Units by mouth at bedtime.   Yes Historical Provider, MD  loperamide (IMODIUM) 2 MG capsule Take 2-4 mg by mouth every 6 (six) hours as needed for diarrhea or loose stools.    Yes Historical Provider, MD  magic mouthwash SOLN Take 10 mLs by mouth 4 (four) times daily. Pt is to swab in the mouth and on the lips four times daily. 05/02/16 05/16/16 Yes Historical Provider, MD  Nutritional Supplements (NUTRITIONAL SUPPLEMENT PO) Take 90 mLs by mouth 3 (  three) times daily. Medpass   Yes Historical Provider, MD  Polyethyl Glycol-Propyl Glycol (SYSTANE ULTRA) 0.4-0.3 % SOLN Place 1 drop into both eyes 3 (three) times daily.   Yes Historical Provider, MD  promethazine (PHENERGAN) 25 MG/ML injection Inject 25 mg into the muscle every 6 (six) hours as needed for nausea or vomiting.    Yes Historical Provider, MD  Protein (PROCEL) POWD Take 1 scoop by mouth 2 (two) times daily.   Yes Historical Provider, MD  sennosides-docusate sodium (SENOKOT-S) 8.6-50 MG tablet Take 1 tablet by  mouth daily as needed for constipation.   Yes Historical Provider, MD       Physical Exam: BP 122/64 (BP Location: Left Arm)   Pulse 98   Temp 102.5 F (39.2 C) (Oral)   Resp 23   Ht 6' 3.98" (1.93 m)   Wt 86.3 kg (190 lb 4.1 oz)   SpO2 98%   BMI 23.17 kg/m  General appearance: Frail elderly adult male, sleeping, responds to touch and voice, answers one word, says "I want to go to sleep" Eyes: Anicteric, conjunctiva pink, lids and lashes normal. PERRL.    ENT: No nasal deformity, discharge, epistaxis.  Hearing diminished. OP with dried MM without lesions.   Neck: No neck masses.  Trachea midline.  No thyromegaly/tenderness. Lymph: No cervical or supraclavicular lymphadenopathy. Skin: Warm and dry.  No jaundice.  No suspicious rashes or lesions. Cardiac: Tachycardic, regular, nl S1-S2, no murmurs appreciated.  Capillary refill is brisk, without mottling. No LE edema.  Radial and DP pulses 2+ and symmetric. Respiratory: Normal respiratory rate and rhythm.  CTAB without rales or wheezes appreciated, shallow inspirations, poor inspiratory effort limits exam. Abdomen: Abdomen soft.  No TTP. No ascites, distension, hepatosplenomegaly.   MSK: No deformities or effusions.  No cyanosis or clubbing. Neuro: Does not follow commands so exam limited.  Responds to verbal stimuli to open mouth, too weak to move arms. Psych: Unable to assess.    Labs on Admission:  I have personally reviewed following labs and imaging studies: CBC:  Recent Labs Lab 05/14/16 1900  WBC 6.6  NEUTROABS 6.3  HGB 12.1*  HCT 35.5*  MCV 91.5  PLT 212   Basic Metabolic Panel:  Recent Labs Lab 05/14/16 1900  NA 137  K 3.3*  CL 108  CO2 16*  GLUCOSE 122*  BUN 21*  CREATININE 1.51*  CALCIUM 8.7*   GFR: Estimated Creatinine Clearance: 34.1 mL/min (by C-G formula based on SCr of 1.51 mg/dL (H)).  Liver Function Tests:  Recent Labs Lab 05/14/16 1900  AST 80*  ALT 57  ALKPHOS 97  BILITOT 0.8    PROT 6.5  ALBUMIN 3.1*   No results for input(s): LIPASE, AMYLASE in the last 168 hours. No results for input(s): AMMONIA in the last 168 hours. Coagulation Profile:  Recent Labs Lab 05/14/16 1900  INR 1.18   Cardiac Enzymes: No results for input(s): CKTOTAL, CKMB, CKMBINDEX, TROPONINI in the last 168 hours. BNP (last 3 results) No results for input(s): PROBNP in the last 8760 hours. HbA1C: No results for input(s): HGBA1C in the last 72 hours. CBG:  Recent Labs Lab 05/14/16 1859  GLUCAP 116*   Lipid Profile: No results for input(s): CHOL, HDL, LDLCALC, TRIG, CHOLHDL, LDLDIRECT in the last 72 hours. Thyroid Function Tests: No results for input(s): TSH, T4TOTAL, FREET4, T3FREE, THYROIDAB in the last 72 hours. Anemia Panel: No results for input(s): VITAMINB12, FOLATE, FERRITIN, TIBC, IRON, RETICCTPCT in the last 72 hours. Sepsis  Labs: Lactic acid 4.5 Invalid input(s): PROCALCITONIN, LACTICIDVEN No results found for this or any previous visit (from the past 240 hour(s)).       Radiological Exams on Admission: Personally reviewed CXR shows right base opacity: Dg Chest 2 View  Result Date: 05/14/2016 CLINICAL DATA:  Fever, sepsis. EXAM: CHEST  2 VIEW COMPARISON:  None. FINDINGS: The heart size and mediastinal contours are within normal limits. Atherosclerosis of thoracic aorta is noted. No pneumothorax or pleural effusion is noted. Left lung is clear. Mild right basilar opacity is noted concerning for subsegmental atelectasis or infiltrate. The visualized skeletal structures are unremarkable. IMPRESSION: Mild right basilar subsegmental atelectasis or pneumonia. Follow-up radiographs are recommended. Aortic atherosclerosis. Electronically Signed   By: Lupita Raider, M.D.   On: 05/14/2016 20:24           Assessment/Plan  1. Sepsis from pneumonia suspected:  Suspected source pneumonia intiially, UA pending. Organism unknown. Patient meets criteria given tachycardia,  tachypnea, fever, and evidence of organ dysfunction.  Lactate 4.5 mmol/L and repeat ordered within 6 hours.  This patient is at high risk of poor outcomes with a qSOFA score of 2 (at least 2 of the following clinical criteria: respiratory rate of 22/min or greater, altered mentation, or systolic blood pressure of 100 mm Hg or less).  Antibiotics delivered in the ED. -Admit to Stepdown -Sepsis bundle utilized:  -Blood and urine cultures drawn  -30 ml/kg bolus given in ED, will repeat lactic acid  -Start targeted antibiotics with ceftriaxone and azithromycin, based on suspected source of infection    -Repeat renal function and complete blood count in AM  -Code SEPSIS called to E-link     2. Hematuria:  Unclear significance.  Coude will be placed. -Obtain UA and culture -Obtain urine electrolytes  3. Acute kidney injury: Likely sepsis related and pre-renal. -Check urine electrolytes -Fluids -Trend BMP  4. Acidosis:  From lactic acidosis and also renal failure. -Fluids and trend BMP  5. Hypokalemia:  -MIVF with K -Trend BMP  6. Elevated LFTs:  Sepsis-related likely -Trend LFT  7. Alzheimer's disease:  Advanced, wheelchair bound, at baseline oriented only to self.  8. Hypertension: Old diagnosis.  No longer on meds. Normotensive.        DVT prophylaxis: Lovenox  Code Status: DO NOT RESUSCITATE  Family Communication: Nieces at bedside  Disposition Plan: Anticipate IV fluids and antibiotics and monitor for improvement. Consults called: None Admission status: INPATIENT, stepdown          Medical decision making: Patient seen at 9:20 PM on 05/14/2016.  The patient was discussed with Dr. Madilyn Hook.  What exists of the patient's chart was reviewed in depth and summarized above.  Clinical condition: requiring aggressive IVF and antibiotics and close monitoring of labs and respiratory status overnight in stepdown unit.  Short term prognosis seems good, given mentation and  hemodynamics at present.        Alberteen Sam [facility] Pager [phone]      At the time of admission, it appears that the appropriate admission status for this patient is INPATIENT. This is judged to be reasonable and necessary in order to provide the required intensity of service to ensure the patient's safety given the presenting symptoms, physical exam findings, and initial radiographic and laboratory data in the context of their chronic comorbidities.  Together, these circumstances are felt to place her/him at high risk for further clinical deterioration threatening life, limb, or organ. The following factors support  the admission status of inpatient:   A. The patient's presenting symptoms include unresponsiveness, fever B. The worrisome physical exam findings include fever, tachycardia, tachypnea C. The initial radiographic and laboratory data are worrisome because of acute kidney injury D. The chronic co-morbidities include advanced dementia.  Former hypertension, no longer on medication. E. Patient requires inpatient status due to high intensity of service, high risk for further deterioration and high frequency of surveillance required because of this acute illness that poses a threat to life or bodily function. F. I certify that at the point of admission it is my clinical judgment that the patient will require inpatient hospital care spanning beyond 2 midnights from the point of admission and that early discharge would result in unnecessary risk of decompensation and readmission or threat to life, limb or bodily function.

## 2016-05-14 NOTE — ED Notes (Signed)
Patient transported to X-ray 

## 2016-05-14 NOTE — ED Triage Notes (Addendum)
[facility]. First fever was 103.4. They gave tylenol about 1.5 hrs ago. Pt has dementia and is verbally unresponsive at baseline. Facility also reports blood in urine earlier today. Not ambulatory.

## 2016-05-14 NOTE — ED Notes (Signed)
Insertion of Foley delayed. Pt bleeding from his penis, RN is aware and has plan to put in Coude 3way.

## 2016-05-14 NOTE — ED Notes (Signed)
EDP Madilyn Hookees notified of possible septic pt.

## 2016-05-14 NOTE — ED Notes (Signed)
[facility] Expected date:  Expected time:  Means of arrival:  Comments: fever

## 2016-05-15 DIAGNOSIS — J189 Pneumonia, unspecified organism: Secondary | ICD-10-CM

## 2016-05-15 LAB — BLOOD CULTURE ID PANEL (REFLEXED)
Acinetobacter baumannii: NOT DETECTED
CANDIDA GLABRATA: NOT DETECTED
CANDIDA TROPICALIS: NOT DETECTED
Candida albicans: NOT DETECTED
Candida krusei: NOT DETECTED
Candida parapsilosis: NOT DETECTED
ENTEROBACTER CLOACAE COMPLEX: NOT DETECTED
Enterobacteriaceae species: NOT DETECTED
Enterococcus species: NOT DETECTED
Escherichia coli: NOT DETECTED
Haemophilus influenzae: NOT DETECTED
KLEBSIELLA PNEUMONIAE: NOT DETECTED
Klebsiella oxytoca: NOT DETECTED
Listeria monocytogenes: NOT DETECTED
NEISSERIA MENINGITIDIS: NOT DETECTED
PROTEUS SPECIES: NOT DETECTED
Pseudomonas aeruginosa: NOT DETECTED
SERRATIA MARCESCENS: NOT DETECTED
STAPHYLOCOCCUS AUREUS BCID: NOT DETECTED
STAPHYLOCOCCUS SPECIES: NOT DETECTED
STREPTOCOCCUS AGALACTIAE: DETECTED — AB
Streptococcus pneumoniae: NOT DETECTED
Streptococcus pyogenes: NOT DETECTED
Streptococcus species: DETECTED — AB

## 2016-05-15 LAB — CBC
HEMATOCRIT: 36.1 % — AB (ref 39.0–52.0)
Hemoglobin: 12 g/dL — ABNORMAL LOW (ref 13.0–17.0)
MCH: 30.9 pg (ref 26.0–34.0)
MCHC: 33.2 g/dL (ref 30.0–36.0)
MCV: 93 fL (ref 78.0–100.0)
PLATELETS: 188 10*3/uL (ref 150–400)
RBC: 3.88 MIL/uL — ABNORMAL LOW (ref 4.22–5.81)
RDW: 14.1 % (ref 11.5–15.5)
WBC: 25.4 10*3/uL — AB (ref 4.0–10.5)

## 2016-05-15 LAB — COMPREHENSIVE METABOLIC PANEL
ALBUMIN: 2.9 g/dL — AB (ref 3.5–5.0)
ALK PHOS: 76 U/L (ref 38–126)
ALT: 57 U/L (ref 17–63)
AST: 94 U/L — AB (ref 15–41)
Anion gap: 10 (ref 5–15)
BILIRUBIN TOTAL: 1.1 mg/dL (ref 0.3–1.2)
BUN: 20 mg/dL (ref 6–20)
CALCIUM: 8.2 mg/dL — AB (ref 8.9–10.3)
CO2: 18 mmol/L — ABNORMAL LOW (ref 22–32)
CREATININE: 1.39 mg/dL — AB (ref 0.61–1.24)
Chloride: 109 mmol/L (ref 101–111)
GFR calc Af Amer: 47 mL/min — ABNORMAL LOW (ref >60)
GFR, EST NON AFRICAN AMERICAN: 41 mL/min — AB (ref >60)
GLUCOSE: 110 mg/dL — AB (ref 65–99)
Potassium: 3.7 mmol/L (ref 3.5–5.1)
Sodium: 137 mmol/L (ref 135–145)
TOTAL PROTEIN: 6.1 g/dL — AB (ref 6.5–8.1)

## 2016-05-15 LAB — LACTIC ACID, PLASMA
LACTIC ACID, VENOUS: 3 mmol/L — AB (ref 0.5–1.9)
Lactic Acid, Venous: 3.9 mmol/L (ref 0.5–1.9)
Lactic Acid, Venous: 2.7 mmol/L (ref 0.5–1.9)

## 2016-05-15 LAB — MRSA PCR SCREENING: MRSA BY PCR: NEGATIVE

## 2016-05-15 MED ORDER — PENICILLIN G POTASSIUM 5000000 UNITS IJ SOLR
4.0000 10*6.[IU] | INTRAVENOUS | Status: AC
  Administered 2016-05-15: 4 10*6.[IU] via INTRAVENOUS
  Filled 2016-05-15: qty 4

## 2016-05-15 MED ORDER — PENICILLIN G POTASSIUM 5000000 UNITS IJ SOLR
4.0000 10*6.[IU] | Freq: Four times a day (QID) | INTRAVENOUS | Status: DC
  Administered 2016-05-15 – 2016-05-16 (×3): 4 10*6.[IU] via INTRAVENOUS
  Filled 2016-05-15 (×4): qty 4

## 2016-05-15 MED ORDER — PENICILLIN G POTASSIUM 5000000 UNITS IJ SOLR
4.0000 10*6.[IU] | Freq: Four times a day (QID) | INTRAMUSCULAR | Status: DC
  Filled 2016-05-15 (×2): qty 4

## 2016-05-15 MED ORDER — PENICILLIN G POTASSIUM 5000000 UNITS IJ SOLR
4.0000 10*6.[IU] | Freq: Four times a day (QID) | INTRAVENOUS | Status: DC
  Filled 2016-05-15 (×2): qty 4

## 2016-05-15 MED ORDER — SODIUM CHLORIDE 0.9 % IV BOLUS (SEPSIS)
1000.0000 mL | Freq: Once | INTRAVENOUS | Status: AC
  Administered 2016-05-15: 1000 mL via INTRAVENOUS

## 2016-05-15 NOTE — Progress Notes (Addendum)
Progress Note    Barry Castillo  ZOX:[id number] DOB: 03/16/1917  DOA: 05/14/2016 PCP: Barry GroutPANDEY, MAHIMA, MD    Brief Narrative:   Chief complaint: F/U fever  Barry Castillo is an 82 y.o. male a PMH of advanced dementia who was sent to the ED from his SNF on 05/14/16 for evaluation of fever.  Assessment/Plan:   Principal Problem:   Sepsis (HCC) Secondary to healthcare associated pneumonia versus UTI Febrile to 102.5 on admission but otherwise hemodynamically stable. Chest x-ray showed a right basilar pneumonia. Lactic acid 4.5, clearing with IV fluids. He was given 30 mL/Kg of fluids, urine and blood cultures were sent, and he was placed on empiric vancomycin and Zosyn. Antibiotics subsequently narrowed to Rocephin/azithromycin. We'll get a speech therapy evaluation. Lung exam is fairly benign, raising the specter of whether or not this is the true source of his infection. MRSA PCR screening is negative. Urinalysis does show signs of infection with many bacteria and too numerous to count WBCs. We'll continue Rocephin/azithromycin for now and follow culture results. WBC markedly elevated compared to admission values, may need to broaden antibiotic coverage back if trend continues. Repeat chest x-ray in the morning.  *Addendum: 2/2 blood cultures now growing strep algalactiae, will narrow antibiotics to penicillin G.  Active Problems:   Alzheimer's type dementia with late onset without behavioral disturbance From a SNF and will return there at discharge.    Hypokalemia Repleted with potassium added to IV fluids.    Acidosis Improving. Likely from elevated serum lactate.    Elevated LFTs AST only mildly elevated. Other LFTs WNL.    AKI (acute kidney injury) (HCC) Likely secondary to sepsis. Creatinine improving with hydration. Baseline creatinine is 0.8. Current creatinine 0.59 over usual baseline values on admission. Continue IV fluids.   Family Communication/Anticipated D/C date  and plan/Code Status   DVT prophylaxis: Lovenox ordered. Code Status: DNR.  Family Communication: No family currently at the bedside. Disposition Plan: [facility] when source of infection identified, and treatment plan outlined, likely another 24-48 hours.   Medical Consultants:    None.   Procedures:    None  Anti-Infectives:    Vancomycin/Zosyn 1  Rocephin/azithromycin 05/14/16--->05/15/16  Penicillin G 05/15/16--->  Subjective:   The patient had advanced dementia, pleasantly confused. Review of symptoms is negative for dyspnea, pain, nausea/vomiting.  Objective:    Vitals:   05/15/16 0500 05/15/16 0600 05/15/16 0700 05/15/16 0800  BP:    (!) 106/48  Pulse: 75 89 80 82  Resp: 19 19 (!) 22 (!) 26  Temp:    (!) 100.4 F (38 C)  TempSrc:    Oral  SpO2: 96% 97% 96% 99%  Weight:      Height:        Intake/Output Summary (Last 24 hours) at 05/15/16 0945 Last data filed at 05/15/16 0800  Gross per 24 hour  Intake          4053.33 ml  Output              610 ml  Net          3443.33 ml   Filed Weights   05/14/16 1900 05/14/16 2239  Weight: 86.3 kg (190 lb 4.1 oz) 85.8 kg (189 lb 2.5 oz)    Exam: General exam: Frail, elderly, chronically ill appearing. Respiratory system: Diminished with poor effort. No wheeze. Cardiovascular system: S1 & S2 heard, RRR. No JVD,  rubs, gallops or clicks. No murmurs. Gastrointestinal  system: Abdomen is nondistended, soft and nontender. No organomegaly or masses felt. Normal bowel sounds heard. Central nervous system: Somnolent, disoriented. Resting tremor right hand. No focal neurological deficits. Extremities: No clubbing,  or cyanosis. 2+ edema BLE. Skin: Scattered ecchymosis, thin skin. Psychiatry: Judgement and insight appear impaired. Mood & affect appropriate.   Data Reviewed:   I have personally reviewed following labs and imaging studies:  Labs: Basic Metabolic Panel:  Recent Labs Lab  05/14/16 1900 05/15/16 0127  NA 137 137  K 3.3* 3.7  CL 108 109  CO2 16* 18*  GLUCOSE 122* 110*  BUN 21* 20  CREATININE 1.51* 1.39*  CALCIUM 8.7* 8.2*   GFR Estimated Creatinine Clearance: 36.3 mL/min (by C-G formula based on SCr of 1.39 mg/dL (H)). Liver Function Tests:  Recent Labs Lab 05/14/16 1900 05/15/16 0127  AST 80* 94*  ALT 57 57  ALKPHOS 97 76  BILITOT 0.8 1.1  PROT 6.5 6.1*  ALBUMIN 3.1* 2.9*   Coagulation profile  Recent Labs Lab 05/14/16 1900  INR 1.18    CBC:  Recent Labs Lab 05/14/16 1900 05/15/16 0127  WBC 6.6 25.4*  NEUTROABS 6.3  --   HGB 12.1* 12.0*  HCT 35.5* 36.1*  MCV 91.5 93.0  PLT 212 188   Cardiac Enzymes: No results for input(s): CKTOTAL, CKMB, CKMBINDEX, TROPONINI in the last 168 hours. BNP (last 3 results) No results for input(s): PROBNP in the last 8760 hours. CBG:  Recent Labs Lab 05/14/16 1859  GLUCAP 116*   D-Dimer: No results for input(s): DDIMER in the last 72 hours. Hgb A1c: No results for input(s): HGBA1C in the last 72 hours. Lipid Profile: No results for input(s): CHOL, HDL, LDLCALC, TRIG, CHOLHDL, LDLDIRECT in the last 72 hours. Thyroid function studies: No results for input(s): TSH, T4TOTAL, T3FREE, THYROIDAB in the last 72 hours.  Invalid input(s): FREET3 Anemia work up: No results for input(s): VITAMINB12, FOLATE, FERRITIN, TIBC, IRON, RETICCTPCT in the last 72 hours. Sepsis Labs:  Recent Labs Lab 05/14/16 1900 05/14/16 1914 05/14/16 2300 05/15/16 0127 05/15/16 0634  WBC 6.6  --   --  25.4*  --   LATICACIDVEN  --  4.57* 3.9* 3.9* 3.0*    Microbiology Recent Results (from the past 240 hour(s))  MRSA PCR Screening     Status: None   Collection Time: 05/14/16 10:44 PM  Result Value Ref Range Status   MRSA by PCR NEGATIVE NEGATIVE Final    Comment:        The GeneXpert MRSA Assay (FDA approved for NASAL specimens only), is one component of a comprehensive MRSA colonization surveillance  program. It is not intended to diagnose MRSA infection nor to guide or monitor treatment for MRSA infections.     Radiology reports: Dg Chest 2 View  Result Date: 05/14/2016 CLINICAL DATA:  Fever, sepsis. EXAM: CHEST  2 VIEW COMPARISON:  None. FINDINGS: The heart size and mediastinal contours are within normal limits. Atherosclerosis of thoracic aorta is noted. No pneumothorax or pleural effusion is noted. Left lung is clear. Mild right basilar opacity is noted concerning for subsegmental atelectasis or infiltrate. The visualized skeletal structures are unremarkable. IMPRESSION: Mild right basilar subsegmental atelectasis or pneumonia. Follow-up radiographs are recommended. Aortic atherosclerosis. Electronically Signed   By: Lupita Raider, M.D.   On: 05/14/2016 20:24   * I have personally reviewed these images*  Medications:   . azithromycin  500 mg Oral QHS  . cefTRIAXone (ROCEPHIN)  IV  1 g Intravenous  QHS  . enoxaparin (LOVENOX) injection  40 mg Subcutaneous QHS  . feeding supplement (ENSURE ENLIVE)  237 mL Oral BID BM   Continuous Infusions: . 0.9 % NaCl with KCl 20 mEq / L 125 mL/hr at 05/15/16 [id number]    Medical decision making is of high complexity and this patient is at high risk of deterioration, therefore this is a level 3 visit.    LOS: 1 day   RAMA,CHRISTINA  [facility] Pager [phone]. If unable to reach me by pager, please call my cell phone at [phone].  *Please refer to [email or web address], password [email or web address] to get updated schedule on who will round on this patient, as hospitalists switch teams weekly. If 7PM-7AM, please contact night-coverage at [email or web address], password [email or web address] for any overnight needs.  05/15/2016, 9:45 AM

## 2016-05-15 NOTE — Progress Notes (Signed)
CRITICAL VALUE ALERT  Critical value received:  Lactic 3.0   Date of notification:  05/15/16  Time of notification:  0747   Critical value read back:Yes.    Nurse who received alert:  Lutricia FeilAudri Moet Mikulski    MD notified (1st page):  Elgergaw   Time of first page:  0820  MD notified (2nd page):Elgergaw  Time of second [id number]  Responding MD: Rama   Time MD responded: [phone]

## 2016-05-15 NOTE — Care Management Note (Signed)
Case Management Note  Patient Details  Name: Barry Castillo MRN: [id number] Date of Birth: 09/02/1916  Subjective/Objective:      sepsis              Action/Plan:  TBD Date:  May 15, 2016 Chart reviewed for concurrent status and case management needs. Will continue to follow patient progress. Discharge Planning: following for needs Expected discharge date: [id number] Marcelle SmilingRhonda Davis, BSN, [place]3, [place]   [phone]   Expected Discharge Date:                  Expected Discharge Plan:  Home/Self Care  In-House Referral:  Clinical Social Work  Discharge planning Services  CM Consult  Post Acute Care Choice:    Choice offered to:     DME Arranged:    DME Agency:     HH Arranged:    HH Agency:     Status of Service:  In process, will continue to follow  If discussed at Long Length of Stay Meetings, dates discussed:    Additional Comments:  Golda AcreDavis, Rhonda Lynn, RN 05/15/2016, 10:22 AM

## 2016-05-15 NOTE — Progress Notes (Signed)
CRITICAL VALUE ALERT  Critical value received:  0124  Date of notification: 05/14/2016  Time of notification:  0247  Critical value read back: yes  Nurse who received alert:  Molly Maduroobert  MD notified (1st page):  Dr. Maryfrances Bunnellanford  Time of first page:  0327  MD notified (2nd page):  Time of second page:  Responding MD: Dr. Maryfrances Bunnellanford  Time MD responded:  [phone]

## 2016-05-15 NOTE — Progress Notes (Signed)
PHARMACY - PHYSICIAN COMMUNICATION CRITICAL VALUE ALERT - BLOOD CULTURE IDENTIFICATION (BCID)  Results for orders placed or performed during the hospital encounter of 05/14/16  Blood Culture ID Panel (Reflexed) (Collected: 05/14/2016  7:00 PM)  Result Value Ref Range   Enterococcus species NOT DETECTED NOT DETECTED   Listeria monocytogenes NOT DETECTED NOT DETECTED   Staphylococcus species NOT DETECTED NOT DETECTED   Staphylococcus aureus NOT DETECTED NOT DETECTED   Streptococcus species DETECTED (A) NOT DETECTED   Streptococcus agalactiae DETECTED (A) NOT DETECTED   Streptococcus pneumoniae NOT DETECTED NOT DETECTED   Streptococcus pyogenes NOT DETECTED NOT DETECTED   Acinetobacter baumannii NOT DETECTED NOT DETECTED   Enterobacteriaceae species NOT DETECTED NOT DETECTED   Enterobacter cloacae complex NOT DETECTED NOT DETECTED   Escherichia coli NOT DETECTED NOT DETECTED   Klebsiella oxytoca NOT DETECTED NOT DETECTED   Klebsiella pneumoniae NOT DETECTED NOT DETECTED   Proteus species NOT DETECTED NOT DETECTED   Serratia marcescens NOT DETECTED NOT DETECTED   Haemophilus influenzae NOT DETECTED NOT DETECTED   Neisseria meningitidis NOT DETECTED NOT DETECTED   Pseudomonas aeruginosa NOT DETECTED NOT DETECTED   Candida albicans NOT DETECTED NOT DETECTED   Candida glabrata NOT DETECTED NOT DETECTED   Candida krusei NOT DETECTED NOT DETECTED   Candida parapsilosis NOT DETECTED NOT DETECTED   Candida tropicalis NOT DETECTED NOT DETECTED    Name of physician (or Provider) Contacted: Dr. Darnelle Catalanama  Changes to prescribed antibiotics required: Ceftriaxone and Azithromycin --> Penicillin G 4 million units q6h (renally adjusted).  MD does not think HCAP treatment warranted at this point.    Haynes Hoehnolleen Caryn Gienger, PharmD, BCPS 05/15/2016, 4:34 PM  Pager: [phone]

## 2016-05-16 ENCOUNTER — Inpatient Hospital Stay (HOSPITAL_COMMUNITY): Payer: Medicare Other

## 2016-05-16 DIAGNOSIS — N390 Urinary tract infection, site not specified: Secondary | ICD-10-CM | POA: Diagnosis present

## 2016-05-16 LAB — CBC
HEMATOCRIT: 29.9 % — AB (ref 39.0–52.0)
HEMOGLOBIN: 10.3 g/dL — AB (ref 13.0–17.0)
MCH: 31.1 pg (ref 26.0–34.0)
MCHC: 34.4 g/dL (ref 30.0–36.0)
MCV: 90.3 fL (ref 78.0–100.0)
PLATELETS: 142 10*3/uL — AB (ref 150–400)
RBC: 3.31 MIL/uL — AB (ref 4.22–5.81)
RDW: 14.4 % (ref 11.5–15.5)
WBC: 25.8 10*3/uL — AB (ref 4.0–10.5)

## 2016-05-16 LAB — BASIC METABOLIC PANEL
ANION GAP: 4 — AB (ref 5–15)
BUN: 20 mg/dL (ref 6–20)
CHLORIDE: 114 mmol/L — AB (ref 101–111)
CO2: 18 mmol/L — ABNORMAL LOW (ref 22–32)
Calcium: 7.6 mg/dL — ABNORMAL LOW (ref 8.9–10.3)
Creatinine, Ser: 1.2 mg/dL (ref 0.61–1.24)
GFR calc Af Amer: 57 mL/min — ABNORMAL LOW (ref >60)
GFR, EST NON AFRICAN AMERICAN: 49 mL/min — AB (ref >60)
GLUCOSE: 72 mg/dL (ref 65–99)
POTASSIUM: 4.6 mmol/L (ref 3.5–5.1)
Sodium: 136 mmol/L (ref 135–145)

## 2016-05-16 LAB — LACTIC ACID, PLASMA: Lactic Acid, Venous: 1.4 mmol/L (ref 0.5–1.9)

## 2016-05-16 MED ORDER — POLYVINYL ALCOHOL 1.4 % OP SOLN
1.0000 [drp] | Freq: Three times a day (TID) | OPHTHALMIC | Status: DC
  Administered 2016-05-16 – 2016-05-18 (×6): 1 [drp] via OPHTHALMIC
  Filled 2016-05-16: qty 15

## 2016-05-16 MED ORDER — RESOURCE THICKENUP CLEAR PO POWD
ORAL | Status: DC | PRN
  Filled 2016-05-16: qty 125

## 2016-05-16 MED ORDER — DEXTROSE 5 % IV SOLN
2.0000 g | INTRAVENOUS | Status: DC
  Administered 2016-05-16 – 2016-05-18 (×3): 2 g via INTRAVENOUS
  Filled 2016-05-16 (×4): qty 2

## 2016-05-16 NOTE — Evaluation (Addendum)
Clinical/Bedside Swallow Evaluation Patient Details  Name: Barry Castillo MRN: [id number] Date of Birth: 11/01/1916  Today's Date: 05/16/2016 Time: SLP Start Time (ACUTE ONLY): 1025 SLP Stop Time (ACUTE ONLY): 1106 SLP Time Calculation (min) (ACUTE ONLY): 41 min  Past Medical History:  Past Medical History:  Diagnosis Date  . Alzheimer's dementia without behavioral disturbance   . Anemia of chronic disease   . Benign essential HTN   . Chronic kidney disease   . Constipation   . Depression    Chronic  . Klebsiella cystitis   . Osteoarthrosis involving lower leg   . Protein calorie malnutrition (HCC)   . PVD (peripheral vascular disease) (HCC)   . Seborrhea capitis   . Vitamin D deficiency    Past Surgical History: History reviewed. No pertinent surgical history. HPI:  82 yo [facility], AMS - pt found to be septic and was diagnosed with right base pneumonia.  PMH + for dementia, oropharyngeal dysphagia and pt resides at [facility] facility.  Swallow evaluation was ordered.     Assessment / Plan / Recommendation Clinical Impression  Pt presents with symptoms of moderate oropharyngeal dysphagia = cognitive based due to his dementia.  Pt is weak and followed directions intermittently.  Overt indication of aspiration with thin water via cup noted - suspect due to premature spillage of boluses into pharynx/larynx before reflexive swallow trigger.  Pt with very poor mastication abilities due to edentulous status and weakness/poor awareness.   Much improved tolerance of ice cream, applesauce and nectar.  Per notes from SNF, pt has h/o dysphagia and therefore recommend to modify diet currently to mitigate risk.  Given pt is edentulous and weak, will modify to full liquids, nectar thick.  Encouraging pt to help self feed will help maximize his airway protection.      Aspiration Risk  Moderate aspiration risk    Diet Recommendation Nectar-thick liquid (full liquids/nectar thick)    Liquid Administration via: Cup;Straw Medication Administration: Whole meds with puree Supervision: Patient able to self feed;Full supervision/cueing for compensatory strategies Compensations: Slow rate;Small sips/bites Postural Changes: Seated upright at 90 degrees;Remain upright for at least 30 minutes after po intake    Other  Recommendations Oral Care Recommendations: Oral care BID   Follow up Recommendations        Frequency and Duration min 2x/week  1 week       Prognosis Prognosis for Safe Diet Advancement: Guarded Barriers to Reach Goals: Time post onset;Other (Comment) (advanced age and premorbid dysphagia)      Swallow Study   General Date of Onset: 05/16/16 HPI: 82 [facility], AMS - pt found to be septic and was diagnosed with right base pneumonia.  PMH + for dementia, oropharyngeal dysphagia and pt resides at [facility] facility.  Swallow evaluation was ordered.   Type of Study: Bedside Swallow Evaluation Diet Prior to this Study: Regular;Thin liquids Temperature Spikes Noted: Yes (100.4 M) Respiratory Status: Nasal cannula History of Recent Intubation: No Behavior/Cognition: Lethargic/Drowsy;Other (Comment) (pt HOH impairs ability to follow directions) Oral Cavity Assessment: Within Functional Limits Oral Care Completed by SLP: No Oral Cavity - Dentition: Other (Comment) (single tooth upper noted, pt denies using dentures) Vision:  (uncertain) Self-Feeding Abilities: Needs assist Patient Positioning: Upright in bed Baseline Vocal Quality: Low vocal intensity;Hoarse Volitional Cough: Strong (reflexive cough strong, volitional weak) Volitional Swallow: Unable to elicit    Oral/Motor/Sensory Function Overall Oral Motor/Sensory Function: Generalized oral weakness   Ice  Chips Ice chips: Not tested Other Comments: pt dislikes ice, stating it's too cold   Thin Liquid Thin Liquid: Impaired Presentation: Cup;Self Fed;Spoon Oral Phase Impairments:  Reduced labial seal;Reduced lingual movement/coordination Oral Phase Functional Implications: Prolonged oral transit;Oral holding Pharyngeal  Phase Impairments: Cough - Immediate;Throat Clearing - Delayed;Suspected delayed Swallow Other Comments: immediate cough postswallow of thin water, presumed due to loss of liquids into pharynx/larynx     Nectar Thick Nectar Thick Liquid: Impaired Presentation: Cup;Spoon;Straw Oral Phase Impairments: Reduced labial seal;Reduced lingual movement/coordination Oral phase functional implications: Prolonged oral transit Pharyngeal Phase Impairments: Throat Clearing - Delayed;Suspected delayed Swallow   Honey Thick Honey Thick Liquid: Not tested   Puree Puree: Impaired Presentation: Spoon Oral Phase Impairments: Reduced labial seal;Reduced lingual movement/coordination Oral Phase Functional Implications: Prolonged oral transit   Solid   GO   Solid: Impaired Oral Phase Impairments: Impaired mastication;Reduced lingual movement/coordination;Reduced labial seal Oral Phase Functional Implications: Prolonged oral transit;Impaired mastication        Barry Burnetamara Barry Merida, MS [facility] SLP [phone]

## 2016-05-16 NOTE — Progress Notes (Addendum)
Progress Note    Eston EstersWc Saine  WGN:[id number] DOB: 10/13/1916  DOA: 05/14/2016 PCP: Oneal GroutPANDEY, MAHIMA, MD    Brief Narrative:   Chief complaint: F/U fever  Barry Castillo is an 82 y.o. male a PMH of advanced dementia who was sent to the ED from his SNF on 05/14/16 for evaluation of fever.  Assessment/Plan:   Principal Problem:   Sepsis (HCC) Secondary to UTI Febrile to 102.5 on admission but otherwise hemodynamically stable. Chest x-ray showed a possible right basilar pneumonia. Lactic acid 4.5, has now cleared with IV fluids. He was given 30 mL/Kg of fluids, urine and blood cultures were sent, and he was placed on empiric vancomycin and Zosyn. Antibiotics subsequently narrowed to Rocephin/azithromycin, then to Penicillin G when 2/2 blood cultures were + for strep agalactiae. Antibiotics changed again to Rocephin once urine cultures grew both strep agalactiae and Klebsiella. At this point, the source of sepsis is likely his UTI and not pneumonia, although he is at risk for pneumonia given his dysphasia and somnolence. Speech therapy evaluation performed, full liquid diet recommended for now.  MRSA PCR screening is negative. WBC markedly elevated compared to admission values. Repeat chest x-ray remains underwhelming with mild vascular congestion. Reduce IV fluids. Continue Rocephin.  Active Problems:   Alzheimer's type dementia with late onset without behavioral disturbance From a SNF and will return there at discharge.    Hypokalemia Repleted with potassium added to IV fluids.    Acidosis Improving. Likely from elevated serum lactate.    Elevated LFTs AST only mildly elevated. Other LFTs WNL.    AKI (acute kidney injury) (HCC) Likely secondary to sepsis. Creatinine trending down with IV fluids.   Family Communication/Anticipated D/C date and plan/Code Status   DVT prophylaxis: Lovenox ordered. Code Status: DNR.  Family Communication: No family currently at the  bedside.Attempted to niece, number not in service. Nephew's wife updated by telephone who will pass the information along to other family members. [facility] source of infection identified, and treatment plan outlined, likely another 24-48 hours.   Medical Consultants:    None.   Procedures:    None  Anti-Infectives:    Vancomycin/Zosyn 1  Rocephin/azithromycin 05/14/16--->05/15/16  Penicillin G 05/15/16--->05/16/16  Rocephin 05/16/16--->   Subjective:   The patient had advanced dementia, Somnolent. Review of symptoms is negative for dyspnea, pain, nausea/vomiting.  Objective:    Vitals:   05/16/16 0500 05/16/16 0600 05/16/16 0800 05/16/16 1200  BP:   133/80 123/70  Pulse: 73 77 74 66  Resp: 20 20 (!) 26 (!) 25  Temp:   99.1 F (37.3 C) 99.1 F (37.3 C)  TempSrc:   Oral Oral  SpO2: 100% 100% 100% 100%  Weight:      Height:        Intake/Output Summary (Last 24 hours) at 05/16/16 1636 Last data filed at 05/16/16 1200  Gross per 24 hour  Intake             2938 ml  Output              735 ml  Net             2203 ml   Filed Weights   05/14/16 1900 05/14/16 2239  Weight: 86.3 kg (190 lb 4.1 oz) 85.8 kg (189 lb 2.5 oz)    Exam: General exam: Frail, elderly, chronically ill appearing. Respiratory system: Diminished with poor effort. No wheeze. Cardiovascular system: S1 &  S2 heard, RRR. No JVD,  rubs, gallops or clicks. No murmurs. Gastrointestinal system: Abdomen is nondistended, soft and nontender. No organomegaly or masses felt. Normal bowel sounds heard. Central nervous system: Somnolent, disoriented. Resting tremor right hand. No focal neurological deficits. Extremities: No clubbing,  or cyanosis. 2+ edema BLE. Skin: Scattered ecchymosis, thin skin. Psychiatry: Judgement and insight appear impaired. Mood & affect appropriate.   Data Reviewed:   I have personally reviewed following labs and imaging  studies:  Labs: Basic Metabolic Panel:  Recent Labs Lab 05/14/16 1900 05/15/16 0127 05/16/16 0330  NA 137 137 136  K 3.3* 3.7 4.6  CL 108 109 114*  CO2 16* 18* 18*  GLUCOSE 122* 110* 72  BUN 21* 20 20  CREATININE 1.51* 1.39* 1.20  CALCIUM 8.7* 8.2* 7.6*   GFR Estimated Creatinine Clearance: 42.1 mL/min (by C-G formula based on SCr of 1.2 mg/dL). Liver Function Tests:  Recent Labs Lab 05/14/16 1900 05/15/16 0127  AST 80* 94*  ALT 57 57  ALKPHOS 97 76  BILITOT 0.8 1.1  PROT 6.5 6.1*  ALBUMIN 3.1* 2.9*   Coagulation profile  Recent Labs Lab 05/14/16 1900  INR 1.18    CBC:  Recent Labs Lab 05/14/16 1900 05/15/16 0127 05/16/16 0330  WBC 6.6 25.4* 25.8*  NEUTROABS 6.3  --   --   HGB 12.1* 12.0* 10.3*  HCT 35.5* 36.1* 29.9*  MCV 91.5 93.0 90.3  PLT 212 188 142*   Cardiac Enzymes: No results for input(s): CKTOTAL, CKMB, CKMBINDEX, TROPONINI in the last 168 hours. BNP (last 3 results) No results for input(s): PROBNP in the last 8760 hours. CBG:  Recent Labs Lab 05/14/16 1859  GLUCAP 116*   D-Dimer: No results for input(s): DDIMER in the last 72 hours. Hgb A1c: No results for input(s): HGBA1C in the last 72 hours. Lipid Profile: No results for input(s): CHOL, HDL, LDLCALC, TRIG, CHOLHDL, LDLDIRECT in the last 72 hours. Thyroid function studies: No results for input(s): TSH, T4TOTAL, T3FREE, THYROIDAB in the last 72 hours.  Invalid input(s): FREET3 Anemia work up: No results for input(s): VITAMINB12, FOLATE, FERRITIN, TIBC, IRON, RETICCTPCT in the last 72 hours. Sepsis Labs:  Recent Labs Lab 05/14/16 1900  05/15/16 0127 05/15/16 0634 05/15/16 0924 05/16/16 0330 05/16/16 0337  WBC 6.6  --  25.4*  --   --  25.8*  --   LATICACIDVEN  --   < > 3.9* 3.0* 2.7*  --  1.4  < > = values in this interval not displayed.  Microbiology Recent Results (from the past 240 hour(s))  Culture, blood (Routine x 2)     Status: Abnormal (Preliminary result)    Collection Time: 05/14/16  7:00 PM  Result Value Ref Range Status   Specimen Description BLOOD BLOOD RIGHT FOREARM  Final   Special Requests BOTTLES DRAWN AEROBIC AND ANAEROBIC  Final   Culture  Setup Time   Final    GRAM POSITIVE COCCI IN CHAINS IN BOTH AEROBIC AND ANAEROBIC BOTTLES Organism ID to follow CRITICAL RESULT CALLED TO, READ BACK BY AND VERIFIED WITH: ERonnette Juniper.D. 15:55 05/15/16 ([email or web address])    Culture (A)  Final    GROUP B STREP(S.AGALACTIAE)ISOLATED SUSCEPTIBILITIES TO FOLLOW Performed at [facility]    Report Status PENDING  Incomplete  Culture, blood (Routine x 2)     Status: Abnormal (Preliminary result)   Collection Time: 05/14/16  7:00 PM  Result Value Ref Range Status   Specimen Description BLOOD RIGHT ANTECUBITAL  Final   Special Requests BOTTLES DRAWN AEROBIC AND ANAEROBIC  Final   Culture  Setup Time   Final    GRAM POSITIVE COCCI IN CHAINS ANAEROBIC BOTTLE ONLY CRITICAL RESULT CALLED TO, READ BACK BY AND VERIFIED WITH: ERonnette Juniper.D. 15:55 05/15/16 ([email or web address])    Culture (A)  Final    GROUP B STREP(S.AGALACTIAE)ISOLATED SUSCEPTIBILITIES TO FOLLOW Performed at [facility]    Report Status PENDING  Incomplete  Blood Culture ID Panel (Reflexed)     Status: Abnormal   Collection Time: 05/14/16  7:00 PM  Result Value Ref Range Status   Enterococcus species NOT DETECTED NOT DETECTED Final   Listeria monocytogenes NOT DETECTED NOT DETECTED Final   Staphylococcus species NOT DETECTED NOT DETECTED Final   Staphylococcus aureus NOT DETECTED NOT DETECTED Final   Streptococcus species DETECTED (A) NOT DETECTED Final    Comment: CRITICAL RESULT CALLED TO, READ BACK BY AND VERIFIED WITH: ERonnette Juniper.D. 15:55 05/15/16 ([email or web address])    Streptococcus agalactiae DETECTED (A) NOT DETECTED Final    Comment: CRITICAL RESULT CALLED TO, READ BACK BY AND VERIFIED WITH: ERonnette Juniper.D. 15:55 05/15/16 ([email or web address])     Streptococcus pneumoniae NOT DETECTED NOT DETECTED Final   Streptococcus pyogenes NOT DETECTED NOT DETECTED Final   Acinetobacter baumannii NOT DETECTED NOT DETECTED Final   Enterobacteriaceae species NOT DETECTED NOT DETECTED Final   Enterobacter cloacae complex NOT DETECTED NOT DETECTED Final   Escherichia coli NOT DETECTED NOT DETECTED Final   Klebsiella oxytoca NOT DETECTED NOT DETECTED Final   Klebsiella pneumoniae NOT DETECTED NOT DETECTED Final   Proteus species NOT DETECTED NOT DETECTED Final   Serratia marcescens NOT DETECTED NOT DETECTED Final   Haemophilus influenzae NOT DETECTED NOT DETECTED Final   Neisseria meningitidis NOT DETECTED NOT DETECTED Final   Pseudomonas aeruginosa NOT DETECTED NOT DETECTED Final   Candida albicans NOT DETECTED NOT DETECTED Final   Candida glabrata NOT DETECTED NOT DETECTED Final   Candida krusei NOT DETECTED NOT DETECTED Final   Candida parapsilosis NOT DETECTED NOT DETECTED Final   Candida tropicalis NOT DETECTED NOT DETECTED Final    Comment: Performed at [facility]  Urine culture     Status: Abnormal (Preliminary result)   Collection Time: 05/14/16 10:18 PM  Result Value Ref Range Status   Specimen Description URINE, CATHETERIZED  Final   Special Requests NONE  Final   Culture (A)  Final    >=100,000 COLONIES/mL KLEBSIELLA PNEUMONIAE >=100,000 COLONIES/mL GROUP B STREP(S.AGALACTIAE)ISOLATED TESTING AGAINST S. AGALACTIAE NOT ROUTINELY PERFORMED DUE TO PREDICTABILITY OF AMP/PEN/VAN SUSCEPTIBILITY. Performed at [facility]    Report Status PENDING  Incomplete  MRSA PCR Screening     Status: None   Collection Time: 05/14/16 10:44 PM  Result Value Ref Range Status   MRSA by PCR NEGATIVE NEGATIVE Final    Comment:        The GeneXpert MRSA Assay (FDA approved for NASAL specimens only), is one component of a comprehensive MRSA colonization surveillance program. It is not intended to diagnose MRSA infection nor to guide  or monitor treatment for MRSA infections.     Radiology reports: Dg Chest 2 View  Result Date: 05/14/2016 CLINICAL DATA:  Fever, sepsis. EXAM: CHEST  2 VIEW COMPARISON:  None. FINDINGS: The heart size and mediastinal contours are within normal limits. Atherosclerosis of thoracic aorta is noted. No pneumothorax or pleural effusion is noted. Left lung is clear. Mild right basilar opacity is noted concerning for subsegmental  atelectasis or infiltrate. The visualized skeletal structures are unremarkable. IMPRESSION: Mild right basilar subsegmental atelectasis or pneumonia. Follow-up radiographs are recommended. Aortic atherosclerosis. Electronically Signed   By: Lupita RaiderJames  Green Jr, M.D.   On: 05/14/2016 20:24   Dg Chest Port 1 View  Result Date: 05/16/2016 CLINICAL DATA:  Fever EXAM: PORTABLE CHEST 1 VIEW COMPARISON:  05/14/2016 FINDINGS: There are small bilateral pleural effusions with bibasilar atelectasis or infiltrates. Heart is borderline in size. Mild vascular congestion. No acute bony abnormality. IMPRESSION: Small bilateral pleural effusions with bibasilar atelectasis or infiltrates. Mild vascular congestion. Electronically Signed   By: Charlett NoseKevin  Dover M.D.   On: 05/16/2016 07:25   * I have personally reviewed these images*  Medications:   . cefTRIAXone (ROCEPHIN)  IV  2 g Intravenous Q24H  . enoxaparin (LOVENOX) injection  40 mg Subcutaneous QHS  . feeding supplement (ENSURE ENLIVE)  237 mL Oral BID BM  . polyvinyl alcohol  1 drop Both Eyes TID   Continuous Infusions: . 0.9 % NaCl with KCl 20 mEq / L 10 mL/hr at 05/16/16 16100812    Medical decision making is of high complexity and this patient is at high risk of deterioration, therefore this is a level 3 visit.    LOS: 2 days   Toddy Boyd  [facility] Pager [phone]. If unable to reach me by pager, please call my cell phone at [phone].  *Please refer to [email or web address], password [email or web address] to get updated schedule on who will round  on this patient, as hospitalists switch teams weekly. If 7PM-7AM, please contact night-coverage at [email or web address], password [email or web address] for any overnight needs.  05/16/2016, 4:36 PM

## 2016-05-17 DIAGNOSIS — N390 Urinary tract infection, site not specified: Secondary | ICD-10-CM

## 2016-05-17 DIAGNOSIS — G301 Alzheimer's disease with late onset: Secondary | ICD-10-CM

## 2016-05-17 DIAGNOSIS — A401 Sepsis due to streptococcus, group B: Principal | ICD-10-CM

## 2016-05-17 DIAGNOSIS — N179 Acute kidney failure, unspecified: Secondary | ICD-10-CM

## 2016-05-17 DIAGNOSIS — F0280 Dementia in other diseases classified elsewhere without behavioral disturbance: Secondary | ICD-10-CM

## 2016-05-17 LAB — CBC
HCT: 30.5 % — ABNORMAL LOW (ref 39.0–52.0)
Hemoglobin: 10.3 g/dL — ABNORMAL LOW (ref 13.0–17.0)
MCH: 31.3 pg (ref 26.0–34.0)
MCHC: 33.8 g/dL (ref 30.0–36.0)
MCV: 92.7 fL (ref 78.0–100.0)
PLATELETS: 153 10*3/uL (ref 150–400)
RBC: 3.29 MIL/uL — ABNORMAL LOW (ref 4.22–5.81)
RDW: 14.5 % (ref 11.5–15.5)
WBC: 15 10*3/uL — AB (ref 4.0–10.5)

## 2016-05-17 LAB — CULTURE, BLOOD (ROUTINE X 2)

## 2016-05-17 LAB — BASIC METABOLIC PANEL
ANION GAP: 4 — AB (ref 5–15)
BUN: 18 mg/dL (ref 6–20)
CALCIUM: 7.8 mg/dL — AB (ref 8.9–10.3)
CO2: 19 mmol/L — ABNORMAL LOW (ref 22–32)
Chloride: 113 mmol/L — ABNORMAL HIGH (ref 101–111)
Creatinine, Ser: 0.93 mg/dL (ref 0.61–1.24)
GFR calc Af Amer: >60 mL/min (ref >60)
GLUCOSE: 83 mg/dL (ref 65–99)
Potassium: 4.4 mmol/L (ref 3.5–5.1)
SODIUM: 136 mmol/L (ref 135–145)

## 2016-05-17 LAB — URINE CULTURE

## 2016-05-17 NOTE — Progress Notes (Signed)
PROGRESS NOTE  Barry Castillo Castillo ZOX:[id number] DOB: 09/04/1916 DOA: 05/14/2016 PCP: Oneal GroutPANDEY, MAHIMA, MD  HPI/Recap of past 2624 hours:  82 year old male with past mental history of advanced dementia sent over from skilled nursing facility on 11/26 for evaluation of fever. Patient found to have sepsis secondary to UTI plus possible right basilar pneumonia. Initial lactic acid level of 4.5. Patient was started on empiric antibiotics following blood cultures. This morning, patient stable. Labs note resolution of sepsis. Patient heavily sleeping but in no acute distress. Does not verbalize any complaints.    Assessment/Plan: Principal Problem:   Sepsis (HCC) secondary to strep agalactiae: Blood cultures positive for strep and urine cultures grew out strep and Klebsiella. Initially on broad-spectrum down to Rocephin and Zithromax and then to penicillin G once culture sensitivities returned. Patient criteria for sepsis on admission given lactic acidosis, hypotension, fever and leukocytosis. Now since resolved and stabilized. Active Problems:   Alzheimer's type dementia with late onset without behavioral disturbance: Stable, will return back to skilled nursing facility   Hypokalemia: Replace potassium and IV fluids   Acidosis: Secondary lactic acidosis    AKI (acute kidney injury) (HCC): Second or sepsis, creatinine now back down to baseline   HCAP (healthcare-associated pneumonia): Initially seen on chest x-ray however with hydration and no hypoxia, infection more felt to be secondary UTI    Code Status: DO NOT RESUSCITATE   Family Communication: Left message for nephew   Disposition Plan: Anticipate return back to skilled nursing in the next 1-2 days    Consultants:  None     Procedures:  None   Antimicrobials:  IV vancomycin/Zosyn 1 dose  IV Rocephin and Zithromax 11/26-11/27  IV penicillin 11/27-28  IV Rocephin 11/20-present   DVT prophylaxis: On  Lovenox   Objective: Vitals:   05/17/16 0600 05/17/16 0800 05/17/16 1105 05/17/16 1210  BP:  (!) 147/68 128/60 109/70  Pulse: 65 69 61 65  Resp:  20 (!) 22 20  Temp:  98.5 F (36.9 C)  98.3 F (36.8 C)  TempSrc:  Oral  Oral  SpO2: 100% 96% 94% 98%  Weight:      Height:        Intake/Output Summary (Last 24 hours) at 05/17/16 1618 Last data filed at 05/17/16 1100  Gross per 24 hour  Intake              465 ml  Output             1035 ml  Net             -570 ml   Filed Weights   05/14/16 1900 05/14/16 2239  Weight: 86.3 kg (190 lb 4.1 oz) 85.8 kg (189 lb 2.5 oz)    Exam:   General:  Chronic confusion, resting comfortably   Cardio vascular: Regular rate and rhythm, S1-S2   Respiratory: clear to auscultation bilaterally   Abdomen: soft, nontender, nondistended, positive bowel sounds   Musculoskeletal: no clubbing or cyanosis or edema   Skin: no skin breaks, tears or lesions  Psychiatry: chronic dementia, no evidence of psychoses    Data Reviewed: CBC:  Recent Labs Lab 05/14/16 1900 05/15/16 0127 05/16/16 0330 05/17/16 0339  WBC 6.6 25.4* 25.8* 15.0*  NEUTROABS 6.3  --   --   --   HGB 12.1* 12.0* 10.3* 10.3*  HCT 35.5* 36.1* 29.9* 30.5*  MCV 91.5 93.0 90.3 92.7  PLT 212 188 142* 153   Basic Metabolic Panel:  Recent Labs Lab  05/14/16 1900 05/15/16 0127 05/16/16 0330 05/17/16 0339  NA 137 137 136 136  K 3.3* 3.7 4.6 4.4  CL 108 109 114* 113*  CO2 16* 18* 18* 19*  GLUCOSE 122* 110* 72 83  BUN 21* 20 20 18   CREATININE 1.51* 1.39* 1.20 0.93  CALCIUM 8.7* 8.2* 7.6* 7.8*   GFR: Estimated Creatinine Clearance: 54.3 mL/min (by C-G formula based on SCr of 0.93 mg/dL). Liver Function Tests:  Recent Labs Lab 05/14/16 1900 05/15/16 0127  AST 80* 94*  ALT 57 57  ALKPHOS 97 76  BILITOT 0.8 1.1  PROT 6.5 6.1*  ALBUMIN 3.1* 2.9*   No results for input(s): LIPASE, AMYLASE in the last 168 hours. No results for input(s): AMMONIA in the last 168  hours. Coagulation Profile:  Recent Labs Lab 05/14/16 1900  INR 1.18   Cardiac Enzymes: No results for input(s): CKTOTAL, CKMB, CKMBINDEX, TROPONINI in the last 168 hours. BNP (last 3 results) No results for input(s): PROBNP in the last 8760 hours. HbA1C: No results for input(s): HGBA1C in the last 72 hours. CBG:  Recent Labs Lab 05/14/16 1859  GLUCAP 116*   Lipid Profile: No results for input(s): CHOL, HDL, LDLCALC, TRIG, CHOLHDL, LDLDIRECT in the last 72 hours. Thyroid Function Tests: No results for input(s): TSH, T4TOTAL, FREET4, T3FREE, THYROIDAB in the last 72 hours. Anemia Panel: No results for input(s): VITAMINB12, FOLATE, FERRITIN, TIBC, IRON, RETICCTPCT in the last 72 hours. Urine analysis:    Component Value Date/Time   COLORURINE YELLOW 05/14/2016 2218   APPEARANCEUR TURBID (A) 05/14/2016 2218   LABSPEC 1.010 05/14/2016 2218   PHURINE 5.0 05/14/2016 2218   GLUCOSEU NEGATIVE 05/14/2016 2218   HGBUR LARGE (A) 05/14/2016 2218   BILIRUBINUR NEGATIVE 05/14/2016 2218   KETONESUR NEGATIVE 05/14/2016 2218   PROTEINUR NEGATIVE 05/14/2016 2218   NITRITE POSITIVE (A) 05/14/2016 2218   LEUKOCYTESUR LARGE (A) 05/14/2016 2218   Sepsis Labs: @LABRCNTIP (procalcitonin:4,lacticidven:4)  ) Recent Results (from the past 240 hour(s))  Culture, blood (Routine x 2)     Status: Abnormal   Collection Time: 05/14/16  7:00 PM  Result Value Ref Range Status   Specimen Description BLOOD BLOOD RIGHT FOREARM  Final   Special Requests BOTTLES DRAWN AEROBIC AND ANAEROBIC 5ML  Final   Culture  Setup Time   Final    GRAM POSITIVE COCCI IN CHAINS IN BOTH AEROBIC AND ANAEROBIC BOTTLES Organism ID to follow CRITICAL RESULT CALLED TO, READ BACK BY AND VERIFIED WITH: ERonnette Juniper. Williamson Pharm.D. 15:55 05/15/16 ([email or web address]) Performed [facility]    Culture GROUP B STREP(S.AGALACTIAE)ISOLATED (A)  Final   Report Status 05/17/2016 FINAL  Final   Organism ID, Bacteria GROUP B  STREP(S.AGALACTIAE)ISOLATED  Final      Susceptibility   Group b strep(s.agalactiae)isolated - MIC*    CLINDAMYCIN <=0.25 SENSITIVE Sensitive     AMPICILLIN <=0.25 SENSITIVE Sensitive     ERYTHROMYCIN <=0.12 SENSITIVE Sensitive     VANCOMYCIN 0.5 SENSITIVE Sensitive     CEFTRIAXONE <=0.12 SENSITIVE Sensitive     LEVOFLOXACIN 1 SENSITIVE Sensitive     * GROUP B STREP(S.AGALACTIAE)ISOLATED  Culture, blood (Routine x 2)     Status: Abnormal   Collection Time: 05/14/16  7:00 PM  Result Value Ref Range Status   Specimen Description BLOOD RIGHT ANTECUBITAL  Final   Special Requests BOTTLES DRAWN AEROBIC AND ANAEROBIC 5ML  Final   Culture  Setup Time   Final    GRAM POSITIVE COCCI IN CHAINS ANAEROBIC BOTTLE ONLY  CRITICAL RESULT CALLED TO, READ BACK BY AND VERIFIED WITH: ERonnette Juniper.D. 15:55 05/15/16 ([email or web address])    Culture (A)  Final    GROUP B STREP(S.AGALACTIAE)ISOLATED SUSCEPTIBILITIES PERFORMED ON PREVIOUS CULTURE WITHIN THE LAST 5 DAYS. Performed at [facility]    Report Status 05/17/2016 FINAL  Final  Blood Culture ID Panel (Reflexed)     Status: Abnormal   Collection Time: 05/14/16  7:00 PM  Result Value Ref Range Status   Enterococcus species NOT DETECTED NOT DETECTED Final   Listeria monocytogenes NOT DETECTED NOT DETECTED Final   Staphylococcus species NOT DETECTED NOT DETECTED Final   Staphylococcus aureus NOT DETECTED NOT DETECTED Final   Streptococcus species DETECTED (A) NOT DETECTED Final    Comment: CRITICAL RESULT CALLED TO, READ BACK BY AND VERIFIED WITH: ERonnette Juniper.D. 15:55 05/15/16 ([email or web address])    Streptococcus agalactiae DETECTED (A) NOT DETECTED Final    Comment: CRITICAL RESULT CALLED TO, READ BACK BY AND VERIFIED WITH: ERonnette Juniper.D. 15:55 05/15/16 ([email or web address])    Streptococcus pneumoniae NOT DETECTED NOT DETECTED Final   Streptococcus pyogenes NOT DETECTED NOT DETECTED Final   Acinetobacter baumannii NOT DETECTED NOT DETECTED Final    Enterobacteriaceae species NOT DETECTED NOT DETECTED Final   Enterobacter cloacae complex NOT DETECTED NOT DETECTED Final   Escherichia coli NOT DETECTED NOT DETECTED Final   Klebsiella oxytoca NOT DETECTED NOT DETECTED Final   Klebsiella pneumoniae NOT DETECTED NOT DETECTED Final   Proteus species NOT DETECTED NOT DETECTED Final   Serratia marcescens NOT DETECTED NOT DETECTED Final   Haemophilus influenzae NOT DETECTED NOT DETECTED Final   Neisseria meningitidis NOT DETECTED NOT DETECTED Final   Pseudomonas aeruginosa NOT DETECTED NOT DETECTED Final   Candida albicans NOT DETECTED NOT DETECTED Final   Candida glabrata NOT DETECTED NOT DETECTED Final   Candida krusei NOT DETECTED NOT DETECTED Final   Candida parapsilosis NOT DETECTED NOT DETECTED Final   Candida tropicalis NOT DETECTED NOT DETECTED Final    Comment: Performed at [facility]  Urine culture     Status: Abnormal   Collection Time: 05/14/16 10:18 PM  Result Value Ref Range Status   Specimen Description URINE, CATHETERIZED  Final   Special Requests NONE  Final   Culture (A)  Final    >=100,000 COLONIES/mL KLEBSIELLA PNEUMONIAE >=100,000 COLONIES/mL STREPTOCOCCUS AGALACTIAE TESTING AGAINST S. AGALACTIAE NOT ROUTINELY PERFORMED DUE TO PREDICTABILITY OF AMP/PEN/VAN SUSCEPTIBILITY. Performed at [facility]    Report Status 05/17/2016 FINAL  Final   Organism ID, Bacteria KLEBSIELLA PNEUMONIAE (A)  Final      Susceptibility   Klebsiella pneumoniae - MIC*    AMPICILLIN >=32 RESISTANT Resistant     CEFAZOLIN <=4 SENSITIVE Sensitive     CEFTRIAXONE <=1 SENSITIVE Sensitive     CIPROFLOXACIN <=0.25 SENSITIVE Sensitive     GENTAMICIN <=1 SENSITIVE Sensitive     IMIPENEM 1 SENSITIVE Sensitive     NITROFURANTOIN 64 INTERMEDIATE Intermediate     TRIMETH/SULFA <=20 SENSITIVE Sensitive     AMPICILLIN/SULBACTAM 4 SENSITIVE Sensitive     PIP/TAZO <=4 SENSITIVE Sensitive     Extended ESBL NEGATIVE Sensitive     *  >=100,000 COLONIES/mL KLEBSIELLA PNEUMONIAE  MRSA PCR Screening     Status: None   Collection Time: 05/14/16 10:44 PM  Result Value Ref Range Status   MRSA by PCR NEGATIVE NEGATIVE Final    Comment:        The GeneXpert MRSA Assay (FDA approved for NASAL specimens only),  is one component of a comprehensive MRSA colonization surveillance program. It is not intended to diagnose MRSA infection nor to guide or monitor treatment for MRSA infections.       Studies: No results found.  Scheduled Meds: . cefTRIAXone (ROCEPHIN)  IV  2 g Intravenous Q24H  . enoxaparin (LOVENOX) injection  40 mg Subcutaneous QHS  . feeding supplement (ENSURE ENLIVE)  237 mL Oral BID BM  . polyvinyl alcohol  1 drop Both Eyes TID    Continuous Infusions: . 0.9 % NaCl with KCl 20 mEq / L 10 mL/hr at 05/17/16 0800     LOS: 3 days     Hollice Espy, MD [facility] Pager [phone]  If 7PM-7AM, please contact night-coverage [email or web address] Password [email or web address] 05/17/2016, 4:18 PM

## 2016-05-17 NOTE — NC FL2 (Signed)
[place] MEDICAID FL2 LEVEL OF CARE SCREENING TOOL     IDENTIFICATION  Patient Name: Barry Castillo Charlson Birthdate: 08/28/1916 Sex: male Admission Date (Current Location): 05/14/2016  [facility] [place]   Facility and Address:  [facility],  [address], [place] [id number]      Provider Number: [id number]  Attending Physician Name and Address:  Hollice EspySendil K Krishnan, MD  Relative Name and Phone Number:       Current Level of Care: Hospital Recommended Level of Care: Skilled Nursing Facility Prior Approval Number:    Date Approved/Denied:   PASRR Number: [id number]  Discharge Plan: SNF    Current Diagnoses: Patient Active Problem List   Diagnosis Date Noted  . UTI (urinary tract infection) 05/16/2016  . HCAP (healthcare-associated pneumonia) 05/15/2016  . Sepsis (HCC) 05/14/2016  . Hypokalemia 05/14/2016  . Acidosis 05/14/2016  . Elevated LFTs 05/14/2016  . AKI (acute kidney injury) (HCC) 05/14/2016  . Dry eyes 03/15/2016  . Seborrhea capitis 07/26/2015  . Alzheimer's disease 07/26/2015  . Dry eyes due to decreased tear production 07/26/2015  . Chronic depression 07/26/2015  . Protein-calorie malnutrition (HCC) 04/26/2015  . Essential hypertension 10/14/2014  . Alzheimer's type dementia with late onset without behavioral disturbance 09/17/2014  . CN (constipation) 09/17/2014  . Vitamin D deficiency 09/17/2014  . Hypertensive heart and renal disease 09/17/2014  . Major depressive disorder, recurrent episode, mild (HCC) 09/17/2014  . Wheelchair dependence 09/17/2014  . Osteoarthrosis, unspecified whether generalized or localized, involving lower leg 09/17/2014    Orientation RESPIRATION BLADDER Height & Weight     Self  Normal Continent Weight: 189 lb 2.5 oz (85.8 kg) Height:  6\' 3"  (190.5 cm)  BEHAVIORAL SYMPTOMS/MOOD NEUROLOGICAL BOWEL NUTRITION STATUS      Incontinent Diet (Full Liquid Diet)  AMBULATORY STATUS COMMUNICATION  OF NEEDS Skin   Extensive Assist Verbally Normal                       Personal Care Assistance Level of Assistance  Bathing, Dressing Bathing Assistance: Limited assistance   Dressing Assistance: Limited assistance     Functional Limitations Info             SPECIAL CARE FACTORS FREQUENCY                       Contractures      Additional Factors Info  Code Status, Allergies Code Status Info: DNR Allergies Info: NKDA           Current Medications (05/17/2016):  This is the current hospital active medication list Current Facility-Administered Medications  Medication Dose Route Frequency Provider Last Rate Last Dose  . 0.9 % NaCl with KCl 20 mEq/ L  infusion   Intravenous Continuous Maryruth Bunhristina P Rama, MD 10 mL/hr at 05/17/16 0800    . acetaminophen (TYLENOL) tablet 650 mg  650 mg Oral Q6H PRN Alberteen Samhristopher P Danford, MD       Or  . acetaminophen (TYLENOL) suppository 650 mg  650 mg Rectal Q6H PRN Alberteen Samhristopher P Danford, MD      . cefTRIAXone (ROCEPHIN) 2 g in dextrose 5 % 50 mL IVPB  2 g Intravenous Q24H Maryruth Bunhristina P Rama, MD   2 g at 05/17/16 1440  . enoxaparin (LOVENOX) injection 40 mg  40 mg Subcutaneous QHS Alberteen Samhristopher P Danford, MD   40 mg at 05/16/16 2204  . feeding supplement (ENSURE ENLIVE) (ENSURE ENLIVE) liquid 237 mL  237 mL Oral BID BM Alberteen Samhristopher P Danford, MD   237 mL at 05/17/16 1400  . ondansetron (ZOFRAN) tablet 4 mg  4 mg Oral Q6H PRN Alberteen Samhristopher P Danford, MD       Or  . ondansetron (ZOFRAN) injection 4 mg  4 mg Intravenous Q6H PRN Alberteen Samhristopher P Danford, MD      . polyvinyl alcohol (LIQUIFILM TEARS) 1.4 % ophthalmic solution 1 drop  1 drop Both Eyes TID Maryruth Bunhristina P Rama, MD   1 drop at 05/17/16 1504  . RESOURCE THICKENUP CLEAR   Oral PRN Maryruth Bunhristina P Rama, MD      . senna-docusate (Senokot-S) tablet 1 tablet  1 tablet Oral QHS PRN Alberteen Samhristopher P Danford, MD         Discharge Medications: Please see discharge summary for a list of discharge  medications.  Relevant Imaging Results:  Relevant Lab Results:   Additional Information SSN: [id number]  Arlyss RepressHarrison, Ata Pecha F, LCSW

## 2016-05-17 NOTE — Progress Notes (Signed)
Notified K.Kirby NP of change in cardiac rhythm; continue to monitor.

## 2016-05-17 NOTE — Clinical Social Work Note (Signed)
Clinical Social Work Assessment  Patient Details  Name: Barry Castillo MRN: [id number] Date of Birth: 01/05/1917  Date of referral:  05/17/16               Reason for consult:  Discharge Planning                Permission sought to share information with:  Facility Industrial/product designerContact Representative Permission granted to share information::  Yes, Verbal Permission Granted  Name::        Agency::     Relationship::     Contact Information:     Housing/Transportation Living arrangements for the past 2 months:  Skilled Nursing Facility Source of Information:  Other (Comment Required) (Niece) Patient Interpreter Needed:  None Criminal Activity/Legal Involvement Pertinent to Current Situation/Hospitalization:  No - Comment as needed Significant Relationships:  Other Family Members Lives with:  Facility Resident Do you feel safe going back to the place where you live?  Yes Need for family participation in patient care:  Yes (Comment)  Care giving concerns:  No concerns reported.   Social Worker assessment / plan:  [facility]. Pt is a LTC resident with dementia and is unable to participate in d/c planning. CSW spoke with pt's niece, Barry Pacinilizabeth Castillo [phone], to assist with d/c planning. Niece would like pt to [facility] at d[facility]c. Clinicals ave been sent to SNF for review. SNF will readmit when pt is stable for d/c. Pt has Medical laboratory scientific officerBCBS medicare / medicaid. CSW will continue to follow to assist with d/c planning.  Employment status:  Retired Database administratornsurance information:  Managed Medicare, [place] PT Recommendations:  Not assessed at this time Information / Referral to community resources:     Patient/Family's Response to care:  Niece would like pt to return to SNF when stable for d/c.  Patient/Family's Understanding of and Emotional Response to Diagnosis, Current Treatment, and Prognosis:  Niece is aware of pt's medical status. Niece reports that  administration at SNF has been receptive when concerns are brought to their attention. Niece is comfortable with pt returning to [facility] at d/c. Niece appreciates CSW assistance with d/c planning.  Emotional Assessment Appearance:  Appears stated age Attitude/Demeanor/Rapport:  Unable to Assess Affect (typically observed):  Unable to Assess Orientation:  Oriented to Self Alcohol / Substance use:  Not Applicable Psych involvement (Current and /or in the community):  No (Comment)  Discharge Needs  Concerns to be addressed:  Discharge Planning Concerns Readmission within the last 30 days:  No Current discharge risk:  None Barriers to Discharge:  No Barriers Identified   Barry HomansHaidinger, Barry Sokolov Lee, LCSW  [phone] 05/17/2016, 12:16 PM

## 2016-05-18 DIAGNOSIS — R1312 Dysphagia, oropharyngeal phase: Secondary | ICD-10-CM

## 2016-05-18 LAB — CBC
HCT: 33.7 % — ABNORMAL LOW (ref 39.0–52.0)
Hemoglobin: 11.4 g/dL — ABNORMAL LOW (ref 13.0–17.0)
MCH: 31.2 pg (ref 26.0–34.0)
MCHC: 33.8 g/dL (ref 30.0–36.0)
MCV: 92.3 fL (ref 78.0–100.0)
PLATELETS: 190 10*3/uL (ref 150–400)
RBC: 3.65 MIL/uL — ABNORMAL LOW (ref 4.22–5.81)
RDW: 14.2 % (ref 11.5–15.5)
WBC: 8.2 10*3/uL (ref 4.0–10.5)

## 2016-05-18 MED ORDER — RESOURCE THICKENUP CLEAR PO POWD: ORAL | Status: AC

## 2016-05-18 MED ORDER — CEFUROXIME AXETIL 500 MG PO TABS: 500.0000 mg | ORAL_TABLET | Freq: Two times a day (BID) | ORAL | 0 refills | Status: AC

## 2016-05-18 MED ORDER — ACETAMINOPHEN 325 MG PO TABS: 650.0000 mg | ORAL_TABLET | ORAL | Status: AC | PRN

## 2016-05-18 NOTE — Care Management Important Message (Signed)
Important Message  Patient Details  Name: Barry Castillo MRN: [id number] Date of Birth: 04/02/1917   Medicare Important Message Given:  Yes    Haskell FlirtJamison, Eriona Kinchen 05/18/2016, 11:09 AMImportant Message  Patient Details  Name: Barry Castillo MRN: [id number] Date of Birth: 11/22/1916   Medicare Important Message Given:  Yes    Haskell FlirtJamison, Mikaila Grunert 05/18/2016, 11:08 AM

## 2016-05-18 NOTE — Progress Notes (Signed)
[facility] given to [place] [facility]. Patient will be transported via [facility]. Patient and family aware.

## 2016-05-18 NOTE — Progress Notes (Signed)
Patient is medically stable to return to SNF today: [facility] LCSW completed DC paperwork and packet for transport. Patient will transport by EMS.  Family aware of DC and in agreement. Lanora Manislizabeth (niece) contacted via phone and made aware of discharge. [phone]  RN to call report.  Deretha EmoryHannah Braedan Meuth LCSW, MSW Clinical Social Work: Optician, dispensingystem Wide Float Coverage for :  [phone]

## 2016-05-18 NOTE — Discharge Summary (Addendum)
Discharge Summary  Barry Castillo TDV:[id number] DOB: 03-Oct-1916  PCP: Blanchie Serve, MD  Admit date: 05/14/2016 Discharge date: 05/18/2016  Time spent: 25 minutes   Recommendations for Outpatient Follow-up:  1. New medication: Ceftin 500 mg by mouth twice a day 5 days 2. New medication: Thick-It for nectar thick liquids 3. Diet adjusted to full liquids with nectar thick consistency  4. Whole meds with pure, liquid administration via cup and straw, seated upright at 90.  Discharge Diagnoses:  Active Hospital Problems   Diagnosis Date Noted  . Sepsis ([place]) 05/14/2016  . UTI (urinary tract infection) 05/16/2016  . HCAP (healthcare-associated pneumonia) 05/15/2016  . Hypokalemia 05/14/2016  . Acidosis 05/14/2016  . Elevated LFTs 05/14/2016  . AKI (acute kidney injury) ([place]) 05/14/2016  . Alzheimer's type dementia with late onset without behavioral disturbance 09/17/2014    Resolved Hospital Problems   Diagnosis Date Noted Date Resolved  . CAP (community acquired pneumonia) 05/14/2016 05/15/2016    Discharge Condition: Improved, being discharged back to skilled nursing facility   Diet recommendation: Full liquids, nectar thick   Vitals:   05/18/16 0521 05/18/16 1406  BP: 128/69 (!) 123/55  Pulse: (!) 58 60  Resp: 14 16  Temp: 98 F (36.7 C) 98 F (36.7 C)    History of present illness:   82 year old male with past mental history of advanced dementia sent over from skilled nursing facility on 11/26 for evaluation of fever. Patient found to have sepsis secondary to UTI plus possible right basilar pneumonia. Initial lactic acid level of 4.5. Patient was started on empiric antibiotics following blood cultures. Admitted to stepdown on the hospitalist service.  Hospital Course:  Principal Problem:   Sepsis ([place]) secondary to group B strep and Klebsiella, from UTI: Blood cultures positive for strep and urine cultures grew out group B strep  (pansensitive ) and Klebsiella  (resistant only to ampicillin). Initially on broad-spectrum down to Rocephin and Zithromax and then to penicillin G once culture sensitivities returned. Patient met criteria for sepsis on admission given lactic acidosis, hypotension, fever and leukocytosis with urinary source. Over the next few days, white count normalized.  He'll be discharged on Ceftin 500 by mouth twice a day for 5 more days Active Problems:   Alzheimer's type dementia with late onset without behavioral disturbance: Stable, will return back to skilled nursing facility   Hypokalemia: Replaced with IV fluids   Acidosis: Resolved, secondary lactic acidosis from sepsis   Elevated LFTs   AKI (acute kidney injury) ([place]): Secondary to sepsis, creatinine now back at baseline   HCAP (healthcare-associated pneumonia): Initially seen on chest x-ray however with hydration and no hypoxia, infection felt to be more secondary to UTI  Dysphagia: Patient at high risk for aspiration pneumonia. Seen by speech therapy and recommended to be on a full liquid diet with nectar thick liquids  Procedures:  None   Consultations:  None   Discharge Exam: BP (!) 123/55 (BP Location: Left Arm)   Pulse 60   Temp 98 F (36.7 C) (Oral)   Resp 16   Ht 6' 3"  (1.905 m)   Wt 85.8 kg (189 lb 2.5 oz)   SpO2 98%   BMI 23.64 kg/m   General: Chronic dementia  Cardiovascular: RRR S1S2  Respiratory: Clear to auscultation bilaterally   Discharge Instructions You were cared for by a hospitalist during your hospital stay. If you have any questions about your discharge medications or the care you received while you were in the hospital after  you are discharged, you can call the unit and asked to speak with the hospitalist on call if the hospitalist that took care of you is not available. Once you are discharged, your primary care physician will handle any further medical issues. Please note that NO REFILLS for any discharge medications will be authorized once  you are discharged, as it is imperative that you return to your primary care physician (or establish a relationship with a primary care physician if you do not have one) for your aftercare needs so that they can reassess your need for medications and monitor your lab values.  Discharge Instructions    Diet - low sodium heart healthy    Complete by:  As directed    Increase activity slowly    Complete by:  As directed        Medication List    STOP taking these medications   magic mouthwash Soln     TAKE these medications   acetaminophen 325 MG tablet Commonly known as:  TYLENOL Take 2 tablets (650 mg total) by mouth every 4 (four) hours as needed for mild pain, moderate pain or fever. What changed:  reasons to take this   cefUROXime 500 MG tablet Commonly known as:  CEFTIN Take 1 tablet (500 mg total) by mouth 2 (two) times daily with a meal.   cholecalciferol 1000 units tablet Commonly known as:  VITAMIN D Take 2,000 Units by mouth at bedtime.   loperamide 2 MG capsule Commonly known as:  IMODIUM Take 2-4 mg by mouth every 6 (six) hours as needed for diarrhea or loose stools.   NUTRITIONAL SUPPLEMENT PO Take 90 mLs by mouth 3 (three) times daily. Medpass   PROCEL Powd Take 1 scoop by mouth 2 (two) times daily.   promethazine 25 MG/ML injection Commonly known as:  PHENERGAN Inject 25 mg into the muscle every 6 (six) hours as needed for nausea or vomiting.   RESOURCE THICKENUP CLEAR Powd All liquids-nectar think consistency   sennosides-docusate sodium 8.6-50 MG tablet Commonly known as:  SENOKOT-S Take 1 tablet by mouth daily as needed for constipation.   SYSTANE ULTRA 0.4-0.3 % Soln Generic drug:  Polyethyl Glycol-Propyl Glycol Place 1 drop into both eyes 3 (three) times daily.      No Known Allergies    The results of significant diagnostics from this hospitalization (including imaging, microbiology, ancillary and laboratory) are listed below for  reference.    Significant Diagnostic Studies: Dg Chest 2 View  Result Date: 05/14/2016 CLINICAL DATA:  Fever, sepsis. EXAM: CHEST  2 VIEW COMPARISON:  None. FINDINGS: The heart size and mediastinal contours are within normal limits. Atherosclerosis of thoracic aorta is noted. No pneumothorax or pleural effusion is noted. Left lung is clear. Mild right basilar opacity is noted concerning for subsegmental atelectasis or infiltrate. The visualized skeletal structures are unremarkable. IMPRESSION: Mild right basilar subsegmental atelectasis or pneumonia. Follow-up radiographs are recommended. Aortic atherosclerosis. Electronically Signed   By: Marijo Conception, M.D.   On: 05/14/2016 20:24   Dg Chest Port 1 View  Result Date: 05/16/2016 CLINICAL DATA:  Fever EXAM: PORTABLE CHEST 1 VIEW COMPARISON:  05/14/2016 FINDINGS: There are small bilateral pleural effusions with bibasilar atelectasis or infiltrates. Heart is borderline in size. Mild vascular congestion. No acute bony abnormality. IMPRESSION: Small bilateral pleural effusions with bibasilar atelectasis or infiltrates. Mild vascular congestion. Electronically Signed   By: Rolm Baptise M.D.   On: 05/16/2016 07:25    Microbiology: Recent Results (  from the past 240 hour(s))  Culture, blood (Routine x 2)     Status: Abnormal   Collection Time: 05/14/16  7:00 PM  Result Value Ref Range Status   Specimen Description BLOOD BLOOD RIGHT FOREARM  Final   Special Requests BOTTLES DRAWN AEROBIC AND ANAEROBIC 5ML  Final   Culture  Setup Time   Final    GRAM POSITIVE COCCI IN CHAINS IN BOTH AEROBIC AND ANAEROBIC BOTTLES Organism ID to follow CRITICAL RESULT CALLED TO, READ BACK BY AND VERIFIED WITH: EJoanette Gula.D. 15:55 05/15/16 ([email or web address]) Performed at [facility] STREP(S.AGALACTIAE)ISOLATED (A)  Final   Report Status 05/17/2016 FINAL  Final   Organism ID, Bacteria GROUP B STREP(S.AGALACTIAE)ISOLATED  Final       Susceptibility   Group b strep(s.agalactiae)isolated - MIC*    CLINDAMYCIN <=0.25 SENSITIVE Sensitive     AMPICILLIN <=0.25 SENSITIVE Sensitive     ERYTHROMYCIN <=0.12 SENSITIVE Sensitive     VANCOMYCIN 0.5 SENSITIVE Sensitive     CEFTRIAXONE <=0.12 SENSITIVE Sensitive     LEVOFLOXACIN 1 SENSITIVE Sensitive     * GROUP B STREP(S.AGALACTIAE)ISOLATED  Culture, blood (Routine x 2)     Status: Abnormal   Collection Time: 05/14/16  7:00 PM  Result Value Ref Range Status   Specimen Description BLOOD RIGHT ANTECUBITAL  Final   Special Requests BOTTLES DRAWN AEROBIC AND ANAEROBIC 5ML  Final   Culture  Setup Time   Final    GRAM POSITIVE COCCI IN CHAINS ANAEROBIC BOTTLE ONLY CRITICAL RESULT CALLED TO, READ BACK BY AND VERIFIED WITH: EJoanette Gula.D. 15:55 05/15/16 ([email or web address])    Culture (A)  Final    GROUP B STREP(S.AGALACTIAE)ISOLATED SUSCEPTIBILITIES PERFORMED ON PREVIOUS CULTURE WITHIN THE LAST 5 DAYS. Performed at [facility]    Report Status 05/17/2016 FINAL  Final  Blood Culture ID Panel (Reflexed)     Status: Abnormal   Collection Time: 05/14/16  7:00 PM  Result Value Ref Range Status   Enterococcus species NOT DETECTED NOT DETECTED Final   Listeria monocytogenes NOT DETECTED NOT DETECTED Final   Staphylococcus species NOT DETECTED NOT DETECTED Final   Staphylococcus aureus NOT DETECTED NOT DETECTED Final   Streptococcus species DETECTED (A) NOT DETECTED Final    Comment: CRITICAL RESULT CALLED TO, READ BACK BY AND VERIFIED WITH: EJoanette Gula.D. 15:55 05/15/16 ([email or web address])    Streptococcus agalactiae DETECTED (A) NOT DETECTED Final    Comment: CRITICAL RESULT CALLED TO, READ BACK BY AND VERIFIED WITH: EJoanette Gula.D. 15:55 05/15/16 ([email or web address])    Streptococcus pneumoniae NOT DETECTED NOT DETECTED Final   Streptococcus pyogenes NOT DETECTED NOT DETECTED Final   Acinetobacter baumannii NOT DETECTED NOT DETECTED Final   Enterobacteriaceae species NOT  DETECTED NOT DETECTED Final   Enterobacter cloacae complex NOT DETECTED NOT DETECTED Final   Escherichia coli NOT DETECTED NOT DETECTED Final   Klebsiella oxytoca NOT DETECTED NOT DETECTED Final   Klebsiella pneumoniae NOT DETECTED NOT DETECTED Final   Proteus species NOT DETECTED NOT DETECTED Final   Serratia marcescens NOT DETECTED NOT DETECTED Final   Haemophilus influenzae NOT DETECTED NOT DETECTED Final   Neisseria meningitidis NOT DETECTED NOT DETECTED Final   Pseudomonas aeruginosa NOT DETECTED NOT DETECTED Final   Candida albicans NOT DETECTED NOT DETECTED Final   Candida glabrata NOT DETECTED NOT DETECTED Final   Candida krusei NOT DETECTED NOT DETECTED Final   Candida parapsilosis NOT DETECTED NOT DETECTED Final   Candida tropicalis NOT  DETECTED NOT DETECTED Final    Comment: Performed at [facility]  Urine culture     Status: Abnormal   Collection Time: 05/14/16 10:18 PM  Result Value Ref Range Status   Specimen Description URINE, CATHETERIZED  Final   Special Requests NONE  Final   Culture (A)  Final    >=100,000 COLONIES/mL KLEBSIELLA PNEUMONIAE >=100,000 COLONIES/mL STREPTOCOCCUS AGALACTIAE TESTING AGAINST S. AGALACTIAE NOT ROUTINELY PERFORMED DUE TO PREDICTABILITY OF AMP/PEN/VAN SUSCEPTIBILITY. Performed at [facility]    Report Status 05/17/2016 FINAL  Final   Organism ID, Bacteria KLEBSIELLA PNEUMONIAE (A)  Final      Susceptibility   Klebsiella pneumoniae - MIC*    AMPICILLIN >=32 RESISTANT Resistant     CEFAZOLIN <=4 SENSITIVE Sensitive     CEFTRIAXONE <=1 SENSITIVE Sensitive     CIPROFLOXACIN <=0.25 SENSITIVE Sensitive     GENTAMICIN <=1 SENSITIVE Sensitive     IMIPENEM 1 SENSITIVE Sensitive     NITROFURANTOIN 64 INTERMEDIATE Intermediate     TRIMETH/SULFA <=20 SENSITIVE Sensitive     AMPICILLIN/SULBACTAM 4 SENSITIVE Sensitive     PIP/TAZO <=4 SENSITIVE Sensitive     Extended ESBL NEGATIVE Sensitive     * >=100,000 COLONIES/mL KLEBSIELLA  PNEUMONIAE  MRSA PCR Screening     Status: None   Collection Time: 05/14/16 10:44 PM  Result Value Ref Range Status   MRSA by PCR NEGATIVE NEGATIVE Final    Comment:        The GeneXpert MRSA Assay (FDA approved for NASAL specimens only), is one component of a comprehensive MRSA colonization surveillance program. It is not intended to diagnose MRSA infection nor to guide or monitor treatment for MRSA infections.      Labs: Basic Metabolic Panel:  Recent Labs Lab 05/14/16 1900 05/15/16 0127 05/16/16 0330 05/17/16 0339  NA 137 137 136 136  K 3.3* 3.7 4.6 4.4  CL 108 109 114* 113*  CO2 16* 18* 18* 19*  GLUCOSE 122* 110* 72 83  BUN 21* 20 20 18   CREATININE 1.51* 1.39* 1.20 0.93  CALCIUM 8.7* 8.2* 7.6* 7.8*   Liver Function Tests:  Recent Labs Lab 05/14/16 1900 05/15/16 0127  AST 80* 94*  ALT 57 57  ALKPHOS 97 76  BILITOT 0.8 1.1  PROT 6.5 6.1*  ALBUMIN 3.1* 2.9*   No results for input(s): LIPASE, AMYLASE in the last 168 hours. No results for input(s): AMMONIA in the last 168 hours. CBC:  Recent Labs Lab 05/14/16 1900 05/15/16 0127 05/16/16 0330 05/17/16 0339 05/18/16 0919  WBC 6.6 25.4* 25.8* 15.0* 8.2  NEUTROABS 6.3  --   --   --   --   HGB 12.1* 12.0* 10.3* 10.3* 11.4*  HCT 35.5* 36.1* 29.9* 30.5* 33.7*  MCV 91.5 93.0 90.3 92.7 92.3  PLT 212 188 142* 153 190   Cardiac Enzymes: No results for input(s): CKTOTAL, CKMB, CKMBINDEX, TROPONINI in the last 168 hours. BNP: BNP (last 3 results) No results for input(s): BNP in the last 8760 hours.  ProBNP (last 3 results) No results for input(s): PROBNP in the last 8760 hours.  CBG:  Recent Labs Lab 05/14/16 1859  GLUCAP 116*       Signed:  Annita Brod, MD [facility] 05/18/2016, 2:09 PM

## 2016-05-19 ENCOUNTER — Telehealth: Payer: Self-pay

## 2016-05-19 ENCOUNTER — Non-Acute Institutional Stay (SKILLED_NURSING_FACILITY): Payer: Medicare Other | Admitting: Internal Medicine

## 2016-05-19 ENCOUNTER — Encounter: Payer: Self-pay | Admitting: Internal Medicine

## 2016-05-19 DIAGNOSIS — J189 Pneumonia, unspecified organism: Secondary | ICD-10-CM | POA: Diagnosis not present

## 2016-05-19 DIAGNOSIS — M159 Polyosteoarthritis, unspecified: Secondary | ICD-10-CM | POA: Diagnosis not present

## 2016-05-19 DIAGNOSIS — R531 Weakness: Secondary | ICD-10-CM

## 2016-05-19 DIAGNOSIS — B9689 Other specified bacterial agents as the cause of diseases classified elsewhere: Secondary | ICD-10-CM

## 2016-05-19 DIAGNOSIS — G301 Alzheimer's disease with late onset: Secondary | ICD-10-CM | POA: Diagnosis not present

## 2016-05-19 DIAGNOSIS — Z7189 Other specified counseling: Secondary | ICD-10-CM

## 2016-05-19 DIAGNOSIS — F0280 Dementia in other diseases classified elsewhere without behavioral disturbance: Secondary | ICD-10-CM | POA: Diagnosis not present

## 2016-05-19 DIAGNOSIS — E43 Unspecified severe protein-calorie malnutrition: Secondary | ICD-10-CM | POA: Diagnosis not present

## 2016-05-19 DIAGNOSIS — N3090 Cystitis, unspecified without hematuria: Secondary | ICD-10-CM | POA: Diagnosis not present

## 2016-05-19 DIAGNOSIS — D638 Anemia in other chronic diseases classified elsewhere: Secondary | ICD-10-CM

## 2016-05-19 DIAGNOSIS — K5909 Other constipation: Secondary | ICD-10-CM

## 2016-05-19 DIAGNOSIS — R1319 Other dysphagia: Secondary | ICD-10-CM

## 2016-05-19 DIAGNOSIS — B961 Klebsiella pneumoniae [K. pneumoniae] as the cause of diseases classified elsewhere: Secondary | ICD-10-CM

## 2016-05-19 NOTE — Telephone Encounter (Signed)
Re-admission to facility. This is [facility] . [facility] is needed if patient was re-admitted to facility upon discharge. I believe you have already seen him for the re-admit. Hospital discharge [facility] 05/18/16.

## 2016-05-19 NOTE — Progress Notes (Signed)
LOCATION: [facility]  PCP: Oneal Grout, MD   Code Status: Full Code  Goals of care: Advanced Directive information Advanced Directives 05/15/2016  Does Patient Have a Medical Advance Directive? No  Type of Advance Directive -  Does patient want to make changes to medical advance directive? -  Copy of Healthcare Power of Attorney in Chart? -  Would patient like information on creating a medical advance directive? No - Patient declined       Extended Emergency Contact Information Primary Emergency Contact: Sapp,Elizabeth Address: [address]          [place], [place] [address] [place] Home Phone: [phone] Relation: Niece Secondary Emergency Contact: Mapel,Paul Address: [address]          [place], [place] [address] [place] Mobile Phone: [phone] Relation: Nephew   No Known Allergies  Chief Complaint  Patient presents with  . Readmit To SNF    Readmission Visit     HPI:  Patient is a 82 y.o. male seen today for long term care post hospital admission from 05/14/16-05/18/16 with sepsis from  Group B strep and klebsiella UTI and right basilar pneumonia. He was stared on antibiotics. He was seen by SLP for dysphagia and his diet was changed. He is a high aspiration risk. He has advanced dementia. He is seen in his room today.    Review of Systems: unable to obtain with advanced dementia.     Past Medical History:  Diagnosis Date  . Alzheimer's dementia without behavioral disturbance   . Anemia of chronic disease   . Benign essential HTN   . Chronic kidney disease   . Constipation   . Depression    Chronic  . Klebsiella cystitis   . Osteoarthrosis involving lower leg   . Protein calorie malnutrition (HCC)   . PVD (peripheral vascular disease) (HCC)   . Seborrhea capitis   . Vitamin D deficiency    No past surgical history on file. Social History:   reports that he has quit smoking. His smoking  use included Pipe. He has never used smokeless tobacco. He reports that he does not drink alcohol or use drugs.  No family history on file.  Medications:   Medication List       Accurate as of 05/19/16 12:20 PM. Always use your most recent med list.          acetaminophen 325 MG tablet Commonly known as:  TYLENOL Take 2 tablets (650 mg total) by mouth every 4 (four) hours as needed for mild pain, moderate pain or fever.   cefUROXime 500 MG tablet Commonly known as:  CEFTIN Take 1 tablet (500 mg total) by mouth 2 (two) times daily with a meal.   cholecalciferol 1000 units tablet Commonly known as:  VITAMIN D Take 2,000 Units by mouth at bedtime.   loperamide 2 MG capsule Commonly known as:  IMODIUM Take 2-4 mg by mouth every 6 (six) hours as needed for diarrhea or loose stools.   PROCEL Powd Take 1 scoop by mouth 2 (two) times daily.   promethazine 25 MG/ML injection Commonly known as:  PHENERGAN Inject 25 mg into the muscle every 6 (six) hours as needed for nausea or vomiting.   RESOURCE THICKENUP CLEAR Powd All liquids-nectar think consistency   sennosides-docusate sodium 8.6-50 MG tablet Commonly known as:  SENOKOT-S Take 1 tablet by mouth daily as needed for constipation.   SYSTANE ULTRA 0.4-0.3 %  Soln Generic drug:  Polyethyl Glycol-Propyl Glycol Place 1 drop into both eyes 3 (three) times daily.       Immunizations: Immunization History  Administered Date(s) Administered  . Influenza-Unspecified 03/27/2015, 04/11/2016  . PPD Test 09/19/2011, 09/28/2014  . Pneumococcal-Unspecified 03/27/2015     Physical Exam:  Vitals:   05/19/16 1210  BP: 134/71  Pulse: 69  Resp: 18  Temp: 99.4 F (37.4 C)  TempSrc: Oral  SpO2: 95%  Weight: 190 lb 3.2 oz (86.3 kg)  Height: 6\' 4"  (1.93 m)   Body mass index is 23.15 kg/m.  General- elderly frail male, in no acute distress Head- normocephalic, atraumatic Nose- no maxillary or frontal sinus tenderness, no  nasal discharge Throat- moist mucus membrane, oral secretions present Eyes- PERRLA, EOMI, no pallor, no icterus Neck- no cervical lymphadenopathy Cardiovascular- normal s1,s2, no murmur, no leg edema Respiratory- decreased air entry to lung bases, no wheeze, no rhonchi, no crackles, no use of accessory muscles Abdomen- bowel sounds present, soft, non tender Musculoskeletal- able to move all 4 extremities, generalized weakness Neurological- alert and oriented to self, pleasantly confused, resting tremor present Skin- warm and dry, easy bruising Psychiatry- normal mood and affect    Labs reviewed: Basic Metabolic Panel:  Recent Labs  95/62/1311/27/17 0127 05/16/16 0330 05/17/16 0339  NA 137 136 136  K 3.7 4.6 4.4  CL 109 114* 113*  CO2 18* 18* 19*  GLUCOSE 110* 72 83  BUN 20 20 18   CREATININE 1.39* 1.20 0.93  CALCIUM 8.2* 7.6* 7.8*   Liver Function Tests:  Recent Labs  11/24/15 05/14/16 1900 05/15/16 0127  AST 15 80* 94*  ALT 10 57 57  ALKPHOS 70 97 76  BILITOT  --  0.8 1.1  PROT  --  6.5 6.1*  ALBUMIN  --  3.1* 2.9*   No results for input(s): LIPASE, AMYLASE in the last 8760 hours. No results for input(s): AMMONIA in the last 8760 hours. CBC:  Recent Labs  09/23/15  03/01/16  05/14/16 1900  05/16/16 0330 05/17/16 0339 05/18/16 0919  WBC 6.4  < > 6.7  < > 6.6  < > 25.8* 15.0* 8.2  NEUTROABS 3  --  3  --  6.3  --   --   --   --   HGB 11.6*  < > 11.8*  < > 12.1*  < > 10.3* 10.3* 11.4*  HCT 36*  < > 35*  < > 35.5*  < > 29.9* 30.5* 33.7*  MCV  --   --   --   --  91.5  < > 90.3 92.7 92.3  PLT 243  < > 186  < > 212  < > 142* 153 190  < > = values in this interval not displayed. Cardiac Enzymes: No results for input(s): CKTOTAL, CKMB, CKMBINDEX, TROPONINI in the last 8760 hours. BNP: Invalid input(s): POCBNP CBG:  Recent Labs  05/14/16 1859  GLUCAP 116*    Radiological Exams: Dg Chest 2 View  Result Date: 05/14/2016 CLINICAL DATA:  Fever, sepsis. EXAM:  CHEST  2 VIEW COMPARISON:  None. FINDINGS: The heart size and mediastinal contours are within normal limits. Atherosclerosis of thoracic aorta is noted. No pneumothorax or pleural effusion is noted. Left lung is clear. Mild right basilar opacity is noted concerning for subsegmental atelectasis or infiltrate. The visualized skeletal structures are unremarkable. IMPRESSION: Mild right basilar subsegmental atelectasis or pneumonia. Follow-up radiographs are recommended. Aortic atherosclerosis. Electronically Signed   By: Roque LiasJames  Green  Montez HagemanJr, M.D.   On: 05/14/2016 20:24   Dg Chest Port 1 View  Result Date: 05/16/2016 CLINICAL DATA:  Fever EXAM: PORTABLE CHEST 1 VIEW COMPARISON:  05/14/2016 FINDINGS: There are small bilateral pleural effusions with bibasilar atelectasis or infiltrates. Heart is borderline in size. Mild vascular congestion. No acute bony abnormality. IMPRESSION: Small bilateral pleural effusions with bibasilar atelectasis or infiltrates. Mild vascular congestion. Electronically Signed   By: Charlett NoseKevin  Dover M.D.   On: 05/16/2016 07:25    Assessment/Plan  Generalized weakness From deconditioning. With his advanced dementia, his participation in therapy will be limited. He has had functional decline. Spoke with his niece/HCPOA over the phone to review goals of care. Get palliative care consult.  Goals of care discussion With niece [name] over the phone. She would like for patient to be DNR and agrees to palliative care. Get palliative care consult   UTI With sepsis. Continue and complete ceftin 500 mg q12h on 05/23/16. Hydration to be maintained. Start florastor 500 mg bid for antibiotic associated diarrhea  HCAP Continue and complete course of antibiotics, aspiration precautions  Dysphagia With advanced dementia. High aspiration risk. SLP to follow. Continue liquid diet with nectar thick consistency and meds with puree. Aspiration precautions  Severe protein calorie malnutrition RD  consult, decline anticipated, pressure ulcer prophylaxis. Continue procel supplement.   Constipation On senokot s 1 tab daily as needed. Monitor  Generalized OA On prn tylenol order. Appears in no distress. Monitor  Anemia of chronic disease Monitor cbc  Alzheimer's dementia Provide supportive care for now. Fall precautions and pressure ulcer prophylaxis.    Goals of care: long term care   Labs/tests ordered: cbc, cmp 05/24/16  Family/ staff Communication: reviewed care plan with patient, his niece and nursing supervisor    Oneal GroutMAHIMA Kenai Fluegel, MD Internal Medicine [facility] [address] [place], [place] [address] Cell Phone (Monday-Friday 8 am - 5 pm): [phone] On Call: [phone] and follow prompts after 5 pm and on weekends Office Phone: [phone] Office Fax: [phone]

## 2016-06-19 DEATH — deceased

## 2017-07-07 IMAGING — CR DG CHEST 2V
2 series · 2 of 2 positions shown · non-contrast
Comparison: None.

CLINICAL DATA: Fever, sepsis.

EXAM:
CHEST  2 VIEW

[w chest lat]
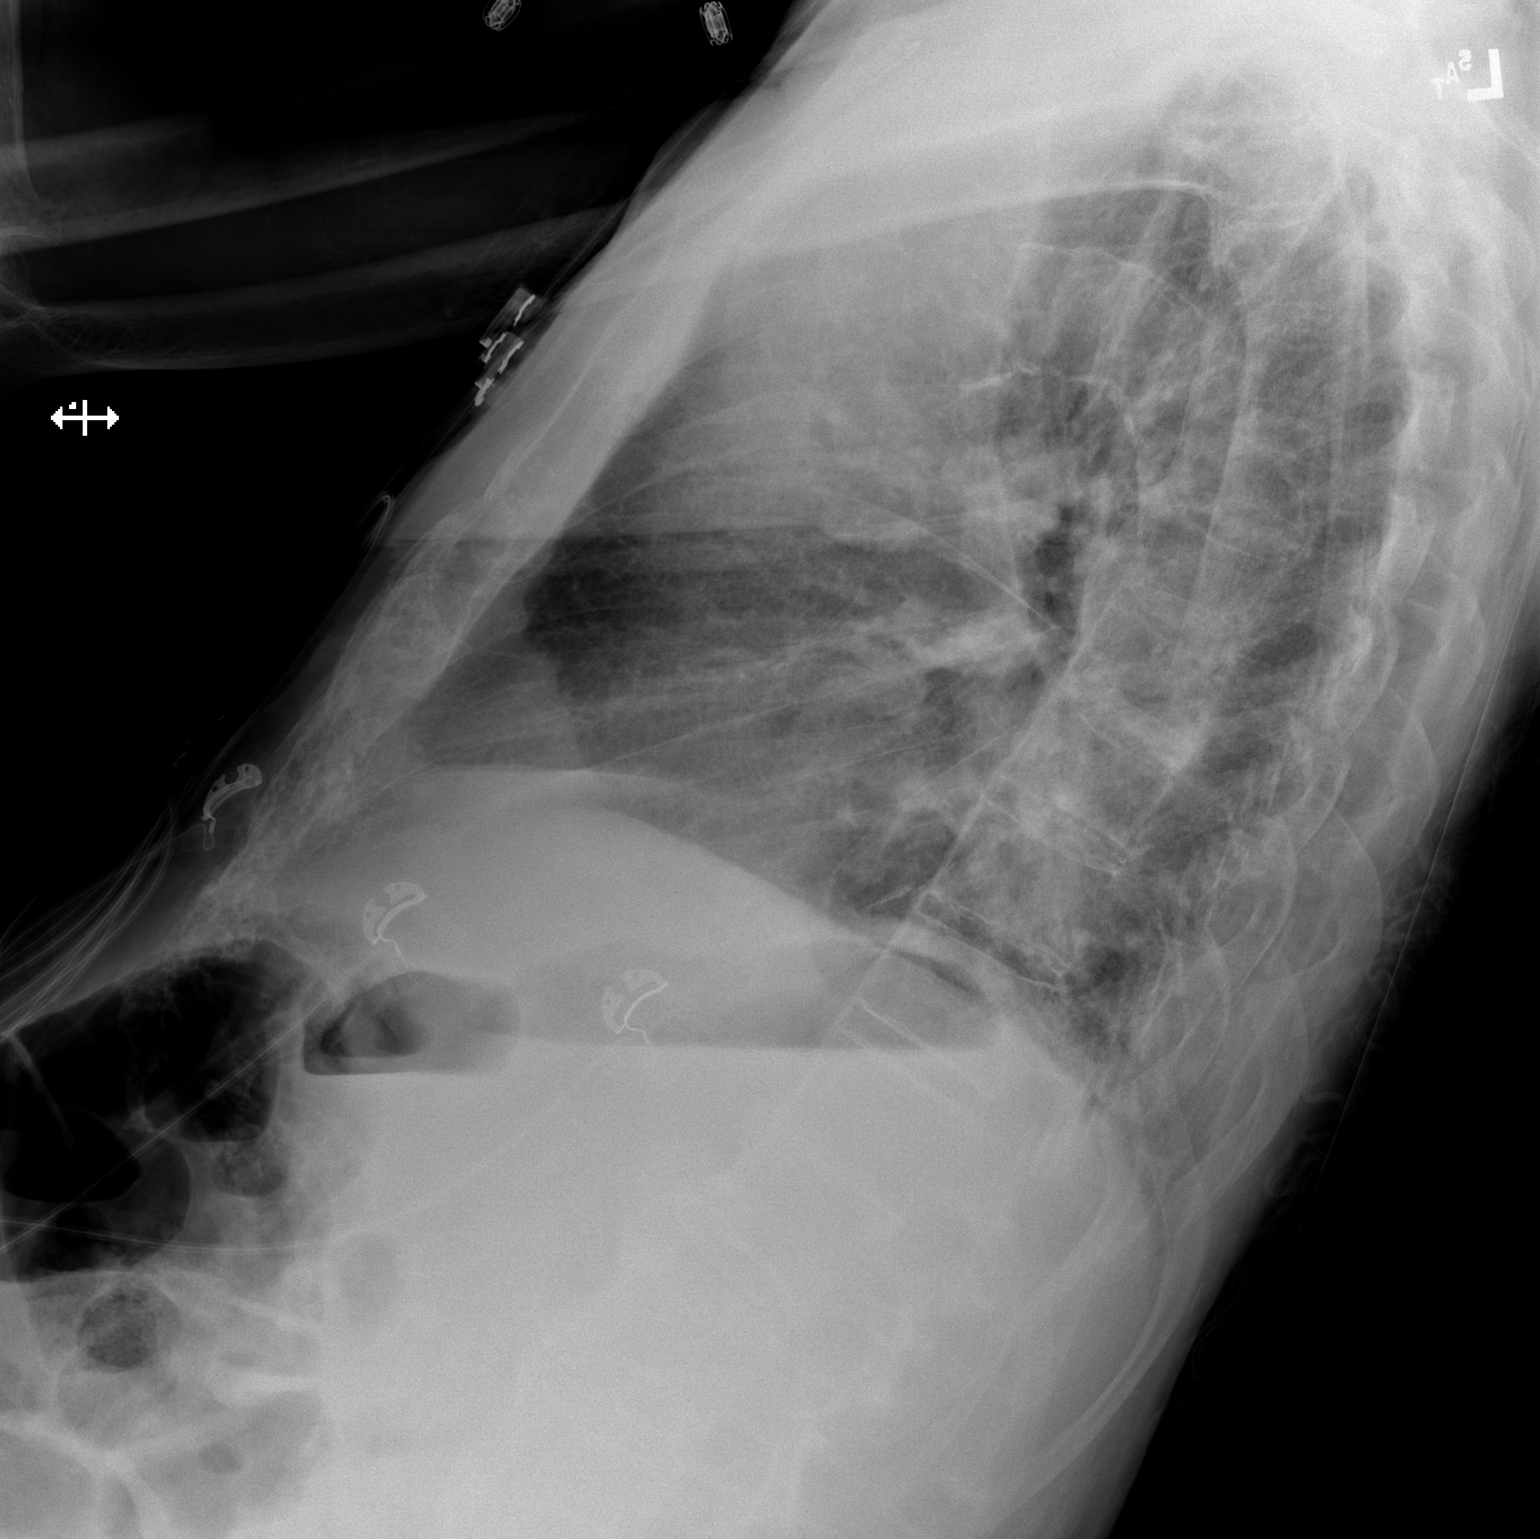

[x chest ap]
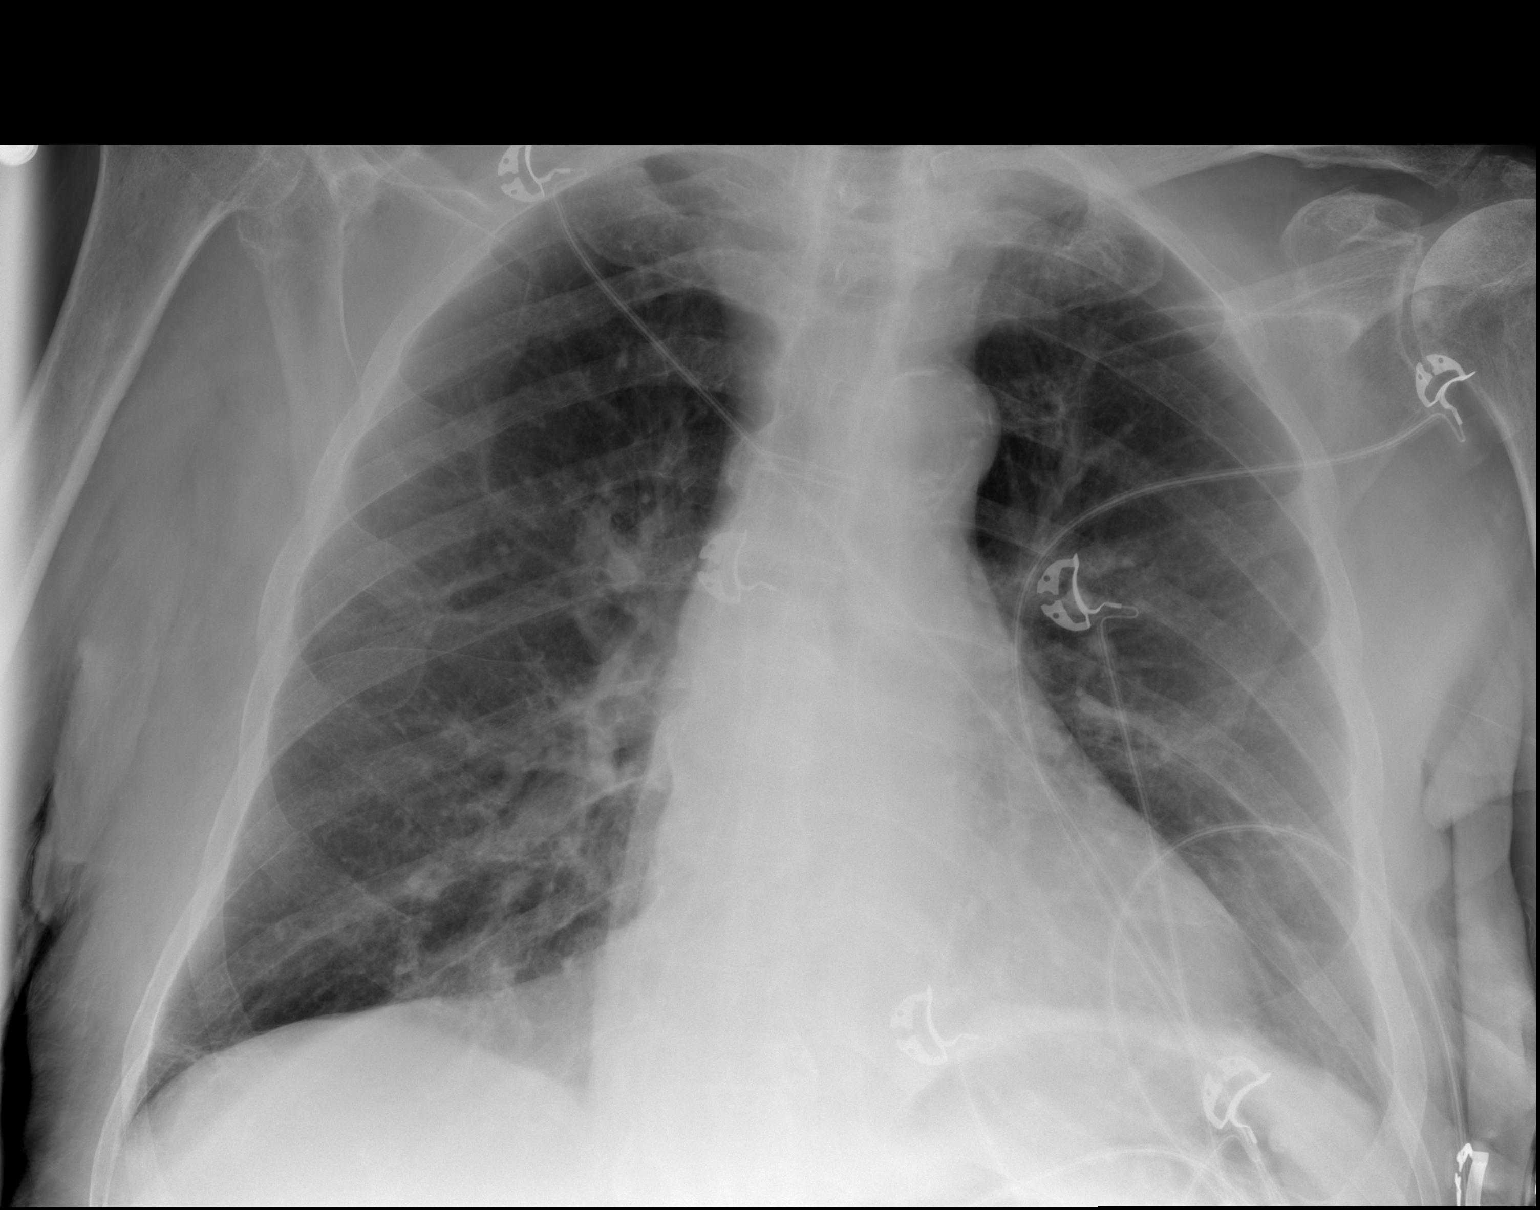

[2 of 2 positions shown; findings below may reference images not displayed]

FINDINGS: The heart size and mediastinal contours are within normal limits.
Atherosclerosis of thoracic aorta is noted. No pneumothorax or
pleural effusion is noted. Left lung is clear. Mild right basilar
opacity is noted concerning for subsegmental atelectasis or
infiltrate. The visualized skeletal structures are unremarkable.
IMPRESSION: Mild right basilar subsegmental atelectasis or pneumonia. Follow-up
radiographs are recommended. Aortic atherosclerosis.

## 2017-07-09 IMAGING — DX DG CHEST 1V PORT
1 series · 1 of 1 positions shown · non-contrast
Comparison: 05/14/2016

CLINICAL DATA: Fever

EXAM:
PORTABLE CHEST 1 VIEW

[chest ap]
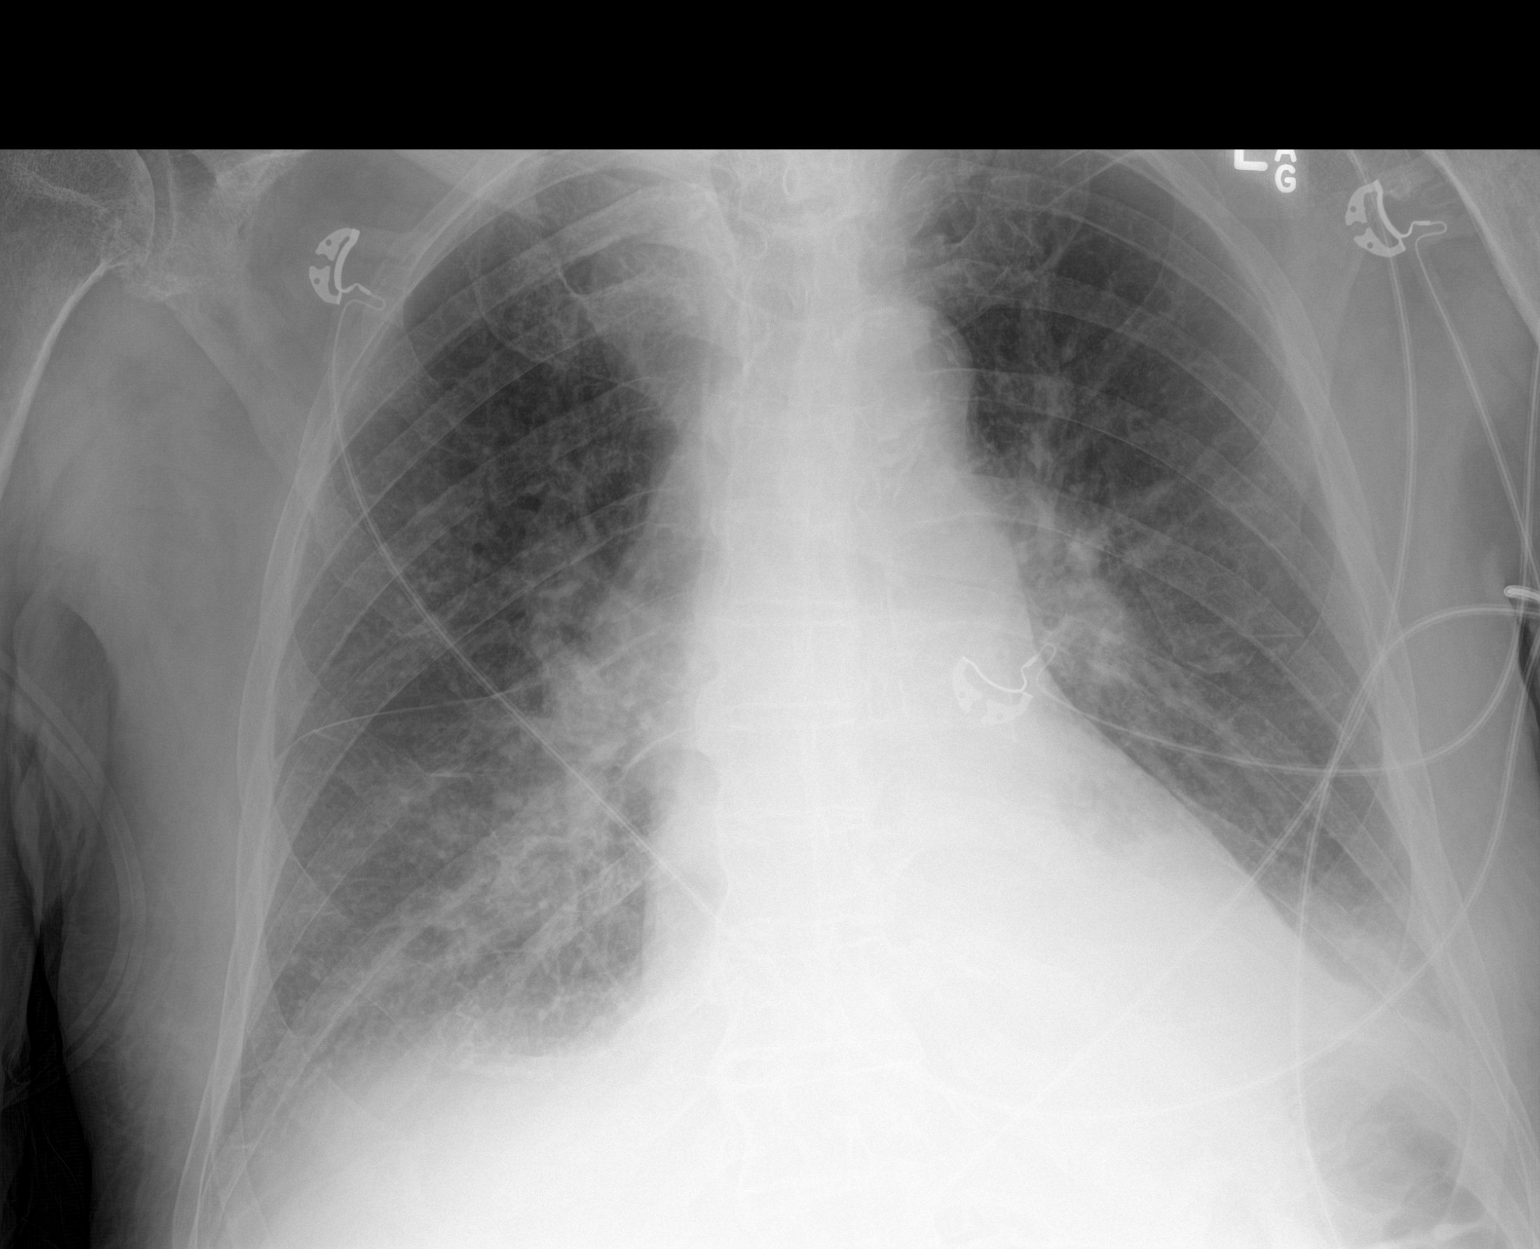

[1 of 1 positions shown; findings below may reference images not displayed]

FINDINGS: There are small bilateral pleural effusions with bibasilar
atelectasis or infiltrates. Heart is borderline in size. Mild
vascular congestion. No acute bony abnormality.
IMPRESSION: Small bilateral pleural effusions with bibasilar atelectasis or
infiltrates.

Mild vascular congestion.
